# Patient Record
Sex: Male | Born: 1995 | Race: White | Hispanic: No | Marital: Married | Smoking: Former smoker
Health system: Southern US, Community
[De-identification: ages and names within clinical notes are randomized; demographics above are authoritative.]

## PROBLEM LIST (undated history)

## (undated) DIAGNOSIS — E049 Nontoxic goiter, unspecified: Secondary | ICD-10-CM

## (undated) DIAGNOSIS — F909 Attention-deficit hyperactivity disorder, unspecified type: Secondary | ICD-10-CM

## (undated) DIAGNOSIS — R630 Anorexia: Secondary | ICD-10-CM

## (undated) DIAGNOSIS — K219 Gastro-esophageal reflux disease without esophagitis: Secondary | ICD-10-CM

## (undated) DIAGNOSIS — E300 Delayed puberty: Secondary | ICD-10-CM

## (undated) DIAGNOSIS — R109 Unspecified abdominal pain: Secondary | ICD-10-CM

## (undated) DIAGNOSIS — G259 Extrapyramidal and movement disorder, unspecified: Secondary | ICD-10-CM

## (undated) DIAGNOSIS — R6250 Unspecified lack of expected normal physiological development in childhood: Secondary | ICD-10-CM

## (undated) HISTORY — DX: Unspecified lack of expected normal physiological development in childhood: R62.50

## (undated) HISTORY — DX: Unspecified abdominal pain: R10.9

## (undated) HISTORY — DX: Extrapyramidal and movement disorder, unspecified: G25.9

## (undated) HISTORY — DX: Attention-deficit hyperactivity disorder, unspecified type: F90.9

## (undated) HISTORY — DX: Anorexia: R63.0

## (undated) HISTORY — DX: Delayed puberty: E30.0

## (undated) HISTORY — PX: SMALL INTESTINE SURGERY: SHX150

## (undated) HISTORY — DX: Nontoxic goiter, unspecified: E04.9

---
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
## 2015-01-03 DIAGNOSIS — J309 Allergic rhinitis, unspecified: Secondary | ICD-10-CM | POA: Insufficient documentation

## 2015-01-03 DIAGNOSIS — K219 Gastro-esophageal reflux disease without esophagitis: Secondary | ICD-10-CM | POA: Insufficient documentation

## 2015-01-03 DIAGNOSIS — F349 Persistent mood [affective] disorder, unspecified: Secondary | ICD-10-CM | POA: Insufficient documentation

## 2015-01-10 ENCOUNTER — Ambulatory Visit: Payer: Self-pay | Admitting: "Endocrinology

## 2015-08-13 ENCOUNTER — Encounter (HOSPITAL_COMMUNITY): Payer: Self-pay | Admitting: *Deleted

## 2015-08-13 ENCOUNTER — Emergency Department (HOSPITAL_COMMUNITY)
Admission: EM | Admit: 2015-08-13 | Discharge: 2015-08-13 | Disposition: A | Discharge status: Home / Self Care | Payer: Medicaid Other | Attending: Emergency Medicine | Admitting: Emergency Medicine

## 2015-08-13 DIAGNOSIS — Z79899 Other long term (current) drug therapy: Secondary | ICD-10-CM | POA: Diagnosis not present

## 2015-08-13 DIAGNOSIS — Z8639 Personal history of other endocrine, nutritional and metabolic disease: Secondary | ICD-10-CM | POA: Insufficient documentation

## 2015-08-13 DIAGNOSIS — Z8669 Personal history of other diseases of the nervous system and sense organs: Secondary | ICD-10-CM | POA: Diagnosis not present

## 2015-08-13 DIAGNOSIS — F909 Attention-deficit hyperactivity disorder, unspecified type: Secondary | ICD-10-CM | POA: Diagnosis not present

## 2015-08-13 DIAGNOSIS — L03115 Cellulitis of right lower limb: Secondary | ICD-10-CM

## 2015-08-13 DIAGNOSIS — L089 Local infection of the skin and subcutaneous tissue, unspecified: Secondary | ICD-10-CM | POA: Diagnosis present

## 2015-08-13 MED ORDER — HYDROCODONE-ACETAMINOPHEN 5-325 MG PO TABS: 2.0000 | ORAL_TABLET | ORAL | Status: DC | PRN

## 2015-08-13 NOTE — ED Notes (Signed)
The pt has a boil on his lt thigh  That has been there for 3 days.  It drained some last pm.  He has been taking an incomplete rx of doxycycline.  He has a hx of boils

## 2015-08-13 NOTE — Discharge Instructions (Signed)
Cellulitis Cellulitis is an infection of the skin and the tissue beneath it. The infected area is usually red and tender. Cellulitis occurs most often in the arms and lower legs.  CAUSES  Cellulitis is caused by bacteria that enter the skin through cracks or cuts in the skin. The most common types of bacteria that cause cellulitis are staphylococci and streptococci. SIGNS AND SYMPTOMS   Redness and warmth.  Swelling.  Tenderness or pain.  Fever. DIAGNOSIS  Your health care provider can usually determine what is wrong based on a physical exam. Blood tests may also be done. TREATMENT  Treatment usually involves taking an antibiotic medicine. HOME CARE INSTRUCTIONS   Take your antibiotic medicine as directed by your health care provider. Finish the antibiotic even if you start to feel better.  Keep the infected arm or leg elevated to reduce swelling.  Apply a warm cloth to the affected area up to 4 times per day to relieve pain.  Take medicines only as directed by your health care provider.  Keep all follow-up visits as directed by your health care provider. SEEK MEDICAL CARE IF:   You notice red streaks coming from the infected area.  Your red area gets larger or turns dark in color.  Your bone or joint underneath the infected area becomes painful after the skin has healed.  Your infection returns in the same area or another area.  You notice a swollen bump in the infected area.  You develop new symptoms.  You have a fever. SEEK IMMEDIATE MEDICAL CARE IF:   You feel very sleepy.  You develop vomiting or diarrhea.  You have a general ill feeling (malaise) with muscle aches and pains.   This information is not intended to replace advice given to you by your health care provider. Make sure you discuss any questions you have with your health care provider.   Document Released: 05/06/2005 Document Revised: 04/17/2015 Document Reviewed: 10/12/2011 Elsevier Interactive  Patient Education [employer]2016 Elsevier Inc.   Follow-up with your primary care provider if symptoms do not improve. Take anabiotic as prescribed. Apply warm compresses. Return to the emergency department if you experience severe worsening of your symptoms, spreading of the redness or increased swelling, drainage of the affected area, fever.

## 2015-08-13 NOTE — ED Provider Notes (Signed)
CSN: [id number]     Arrival date & time 08/13/15  2119 History  By signing my name below, I, [facility] Health InstituteMarrissa Washington, attest that this documentation has been prepared under the direction and in the presence of AvayaSamantha Timberlee Roblero, PA-C. Electronically Signed: Randell PatientMarrissa Washington, ED Scribe. 08/13/2015. 10:11 PM.    Chief Complaint  Patient presents with  . Recurrent Skin Infections   The history is provided by the patient and a relative. No language interpreter was used.   HPI Comments: Andrew Lynch is a 20 y.o. male with an hx of boils who presents to the Emergency Department complaining of a moderately painful, gradually worsening boil on his lateral right thigh onset 3 days ago. Patient reports that the boil on his thigh increased in size and swelling over the the past 2 days until he popped it last night, producing some drainage from the site. He endorses increased redness in the area and that the area feels hard to the touch. Per patient, he was prescribed Tramadol for similar symptoms in the past with no relief of his pain. Patient states that his PCP prescribed him a course of doxycyline today and that he has taken 1 dose of this medication. He has tried warm compresses and applied fatback to the area with no relief. Relative notes that the patient has had similar symptoms due to an unspecified infection multiple times in the past located on his underarms that resolved with I&D and changing his deodorant. Patient denies receiving any pain medication in the past. He denies fever and vomiting.  Past Medical History  Diagnosis Date  . Physical growth delay   . ADHD (attention deficit hyperactivity disorder)   . Goiter   . Puberty delay   . Poor appetite   . Stomach pain   . Movement disorder    History reviewed. No pertinent past surgical history. Family History  Problem Relation Age of Onset  . Diabetes Paternal Uncle   . Diabetes Maternal Grandfather   . Thyroid disease Neg Hx     Social History  Substance Use Topics  . Smoking status: Never Smoker   . Smokeless tobacco: Never Used  . Alcohol Use: No    Review of Systems  Constitutional: Negative for fever.  Gastrointestinal: Negative for vomiting.  Skin: Positive for color change (Redness to right lateal thigh surrouding boil) and wound (Boil on right lateral thigh).  All other systems reviewed and are negative.     Allergies  Review of patient's allergies indicates no known allergies.  Home Medications   Prior to Admission medications   Medication Sig Start Date End Date Taking? Authorizing Provider  cephALEXin (KEFLEX) 500 MG capsule Take 1 capsule (500 mg total) by mouth 4 (four) times daily. 09/11/14   Emilia BeckKaitlyn Szekalski, PA-C  Cetirizine HCl 10 MG TBDP Take by mouth.    Historical Provider, MD  cyproheptadine (PERIACTIN) 4 MG tablet Take 2 tablets (8mg ) Twice daily 12/14/13   David StallMichael J Brennan, MD  diphenhydrAMINE (BENADRYL) 25 MG tablet Take 1 tablet (25 mg total) by mouth every 6 (six) hours as needed for itching. 08/07/14   Trixie DredgeEmily West, PA-C  esomeprazole (NEXIUM) 40 MG capsule Take 40 mg by mouth daily before breakfast.      Historical Provider, MD  FLUoxetine (PROZAC) 10 MG tablet Take 10 mg by mouth daily.      Historical Provider, MD  HYDROcodone-acetaminophen (NORCO/VICODIN) 5-325 MG per tablet Take 1 tablet by mouth every 6 (six) hours as needed for  moderate pain or severe pain. 12/08/14   Hannah Muthersbaugh, PA-C  HYDROcodone-acetaminophen (NORCO/VICODIN) 5-325 MG tablet Take 2 tablets by mouth every 4 (four) hours as needed. 08/13/15   Leightyn Cina Tripp Neco Kling, PA-C  lisdexamfetamine (VYVANSE) 30 MG capsule Take 30 mg by mouth every morning. Trade drug    Historical Provider, MD  loratadine (CLARITIN) 10 MG tablet Take 10 mg by mouth daily.      Historical Provider, MD  Multiple Vitamins-Minerals (MULTIVITAMIN PO) Take by mouth.    Historical Provider, MD  permethrin (ELIMITE) 5 % cream Apply to  from neck to toes, even under fingernails. Apply before bedtime, leave on 12 hours and rinse off in the morning. If you are still itching in 14 days from application, please repeat 11/08/14   Joni Reining Pisciotta, PA-C  predniSONE (DELTASONE) 20 MG tablet 3 tabs po daily x 3 days, then 2 tabs x 3 days, then 1.5 tabs x 3 days, then 1 tab x 3 days, then 0.5 tabs x 3 days 08/07/14   Trixie Dredge, PA-C  traMADol (ULTRAM) 50 MG tablet Take 1 tablet (50 mg total) by mouth every 6 (six) hours as needed. 09/11/14   Kaitlyn Szekalski, PA-C   BP 132/77 mmHg  Pulse 88  Temp(Src) 98.8 F (37.1 C)  Resp 16  Ht 5\' 3"  (1.6 m)  Wt 141 lb 9 oz (64.212 kg)  BMI 25.08 kg/m2  SpO2 98% Physical Exam  Constitutional: He is oriented to person, place, and time. He appears well-developed and well-nourished. No distress.  HENT:  Head: Normocephalic and atraumatic.  Eyes: Conjunctivae are normal. Right eye exhibits no discharge. Left eye exhibits no discharge. No scleral icterus.  Cardiovascular: Normal rate.   Pulmonary/Chest: Effort normal.  Neurological: He is alert and oriented to person, place, and time. Coordination normal.  Skin: Skin is warm and dry. No rash noted. He is not diaphoretic. No erythema. No pallor.  3cm area of redness to lateral R thigh. No fluctuance. No streaking.   Psychiatric: He has a normal mood and affect. His behavior is normal.  Nursing note and vitals reviewed.   ED Course  Procedures   DIAGNOSTIC STUDIES: Oxygen Saturation is 98% on RA, normal by my interpretation.    COORDINATION OF CARE: 9:44 PM Will perform [place] of right lateral thigh. Advised pt to continue taking doxycycline. Discussed treatment plan with pt at bedside and pt agreed to plan.  Labs Review Labs Reviewed - No data to display  Imaging Review No results found. I have personally reviewed and evaluated these images and lab results as part of my medical decision-making.   EKG Interpretation None      MDM    Final diagnoses:  Cellulitis of right lower extremity    Bedside US performed by me. No fluid collection or drainable abscess noted. Pt was prescribed Doxycyline today by PCP. Pt afebrile, in NAD Encourage patient to continue taking doxycycline. Apply warm compresses. Pain medication given. Return precautions outlined in patient discharge instructions.    I personally performed the services described in this documentation, which was scribed in my presence. The recorded information has been reviewed and is accurate.     Lester Kinsman [place], PA-C 08/16/15 1708  Mancel Bale, MD 08/17/15 [phone]

## 2015-08-13 NOTE — ED Notes (Signed)
Pt. Left with all belongings and refused wheelchair. Discharge instructions were reviewed and all questions were answered.  

## 2015-08-15 ENCOUNTER — Emergency Department (HOSPITAL_COMMUNITY)
Admission: EM | Admit: 2015-08-15 | Discharge: 2015-08-15 | Disposition: A | Discharge status: Home / Self Care | Payer: Medicaid Other | Attending: Emergency Medicine | Admitting: Emergency Medicine

## 2015-08-15 ENCOUNTER — Encounter (HOSPITAL_COMMUNITY): Payer: Self-pay | Admitting: *Deleted

## 2015-08-15 DIAGNOSIS — Z8639 Personal history of other endocrine, nutritional and metabolic disease: Secondary | ICD-10-CM | POA: Diagnosis not present

## 2015-08-15 DIAGNOSIS — L02415 Cutaneous abscess of right lower limb: Secondary | ICD-10-CM | POA: Insufficient documentation

## 2015-08-15 DIAGNOSIS — Z792 Long term (current) use of antibiotics: Secondary | ICD-10-CM | POA: Insufficient documentation

## 2015-08-15 DIAGNOSIS — Z8669 Personal history of other diseases of the nervous system and sense organs: Secondary | ICD-10-CM | POA: Insufficient documentation

## 2015-08-15 DIAGNOSIS — F909 Attention-deficit hyperactivity disorder, unspecified type: Secondary | ICD-10-CM | POA: Insufficient documentation

## 2015-08-15 DIAGNOSIS — Z79899 Other long term (current) drug therapy: Secondary | ICD-10-CM | POA: Diagnosis not present

## 2015-08-15 MED ORDER — LIDOCAINE HCL 2 % IJ SOLN
10.0000 mL | Freq: Once | INTRAMUSCULAR | Status: AC
  Administered 2015-08-15: 200 mg
  Filled 2015-08-15: qty 20

## 2015-08-15 NOTE — ED Notes (Signed)
Pt c/o abscess to rt leg x 1 week. States that he was here and told that it looked good and prescribed doxycycline and hydrocodone-acetaminophen. States that hje has been taking them as prescribed.

## 2015-08-15 NOTE — Discharge Instructions (Signed)
Incision and Drainage Incision and drainage is a procedure in which a sac-like structure (cystic structure) is opened and drained. The area to be drained usually contains material such as pus, fluid, or blood.  LET YOUR CAREGIVER KNOW ABOUT:   Allergies to medicine.  Medicines taken, including vitamins, herbs, eyedrops, over-the-counter medicines, and creams.  Use of steroids (by mouth or creams).  Previous problems with anesthetics or numbing medicines.  History of bleeding problems or blood clots.  Previous surgery.  Other health problems, including diabetes and kidney problems.  Possibility of pregnancy, if this applies. RISKS AND COMPLICATIONS  Pain.  Bleeding.  Scarring.  Infection. BEFORE THE PROCEDURE  You may need to have an ultrasound or other imaging tests to see how large or deep your cystic structure is. Blood tests may also be used to determine if you have an infection or how severe the infection is. You may need to have a tetanus shot. PROCEDURE  The affected area is cleaned with a cleaning fluid. The cyst area will then be numbed with a medicine (local anesthetic). A small incision will be made in the cystic structure. A syringe or catheter may be used to drain the contents of the cystic structure, or the contents may be squeezed out. The area will then be flushed with a cleansing solution. After cleansing the area, it is often gently packed with a gauze or another wound dressing. Once it is packed, it will be covered with gauze and tape or some other type of wound dressing. AFTER THE PROCEDURE   Often, you will be allowed to go home right after the procedure.  You may be given antibiotic medicine to prevent or heal an infection.  If the area was packed with gauze or some other wound dressing, you will likely need to come back in 1 to 2 days to get it removed.  The area should heal in about 14 days.   This information is not intended to replace advice given  to you by your health care provider. Make sure you discuss any questions you have with your health care provider.   Document Released: 01/20/2001 Document Revised: 01/26/2012 Document Reviewed: 09/21/2011 Elsevier Interactive Patient Education 2016 Elsevier Inc.  

## 2015-08-15 NOTE — ED Notes (Signed)
States that it had a white head yesterday and last night pus came out. Today area is red with scab.

## 2015-08-15 NOTE — ED Provider Notes (Signed)
CSN: [id number]     Arrival date & time 08/15/15  1732 History   By signing my name below, I, Emmanuella Mensah, attest that this documentation has been prepared under the direction and in the presence of Cheron Schaumann, PA-C. Electronically Signed: Angelene Giovanni, ED Scribe. 08/15/2015. 7:11 PM.     Chief Complaint  Patient presents with  . Abscess   The history is provided by the patient. No language interpreter was used.   HPI Comments: Andrew Lynch is a 20 y.o. male who presents to the Emergency Department complaining of an area of redness and swelling with a white head to the right lateral side of the right thigh onset one week ago. He explains that he was seen 2 days ago and prescribed Doxycycline and Hydrocodone-acetaminophen but he is here today because the area is now draining and it looks more red than it did at that time. He reports NKDA. No alleviating factors noted.    Past Medical History  Diagnosis Date  . Physical growth delay   . ADHD (attention deficit hyperactivity disorder)   . Goiter   . Puberty delay   . Poor appetite   . Stomach pain   . Movement disorder    No past surgical history on file. Family History  Problem Relation Age of Onset  . Diabetes Paternal Uncle   . Diabetes Maternal Grandfather   . Thyroid disease Neg Hx    Social History  Substance Use Topics  . Smoking status: Never Smoker   . Smokeless tobacco: Never Used  . Alcohol Use: No    Review of Systems  Constitutional: Negative for fever.  Skin:       Area of redness and swelling to the right lateral side of right thigh  All other systems reviewed and are negative.     Allergies  Review of patient's allergies indicates no known allergies.  Home Medications   Prior to Admission medications   Medication Sig Start Date End Date Taking? Authorizing Provider  cephALEXin (KEFLEX) 500 MG capsule Take 1 capsule (500 mg total) by mouth 4 (four) times daily. 09/11/14   Emilia Beck, PA-C  Cetirizine HCl 10 MG TBDP Take by mouth.    Historical Provider, MD  cyproheptadine (PERIACTIN) 4 MG tablet Take 2 tablets (8mg ) Twice daily 12/14/13   David Stall, MD  diphenhydrAMINE (BENADRYL) 25 MG tablet Take 1 tablet (25 mg total) by mouth every 6 (six) hours as needed for itching. 08/07/14   Trixie Dredge, PA-C  esomeprazole (NEXIUM) 40 MG capsule Take 40 mg by mouth daily before breakfast.      Historical Provider, MD  FLUoxetine (PROZAC) 10 MG tablet Take 10 mg by mouth daily.      Historical Provider, MD  HYDROcodone-acetaminophen (NORCO/VICODIN) 5-325 MG per tablet Take 1 tablet by mouth every 6 (six) hours as needed for moderate pain or severe pain. 12/08/14   Hannah Muthersbaugh, PA-C  HYDROcodone-acetaminophen (NORCO/VICODIN) 5-325 MG tablet Take 2 tablets by mouth every 4 (four) hours as needed. 08/13/15   Samantha Tripp Dowless, PA-C  lisdexamfetamine (VYVANSE) 30 MG capsule Take 30 mg by mouth every morning. Trade drug    Historical Provider, MD  loratadine (CLARITIN) 10 MG tablet Take 10 mg by mouth daily.      Historical Provider, MD  Multiple Vitamins-Minerals (MULTIVITAMIN PO) Take by mouth.    Historical Provider, MD  permethrin (ELIMITE) 5 % cream Apply to from neck to toes, even under fingernails. Apply  before bedtime, leave on 12 hours and rinse off in the morning. If you are still itching in 14 days from application, please repeat 11/08/14   Joni ReiningNicole Pisciotta, PA-C  predniSONE (DELTASONE) 20 MG tablet 3 tabs po daily x 3 days, then 2 tabs x 3 days, then 1.5 tabs x 3 days, then 1 tab x 3 days, then 0.5 tabs x 3 days 08/07/14   Trixie DredgeEmily West, PA-C  traMADol (ULTRAM) 50 MG tablet Take 1 tablet (50 mg total) by mouth every 6 (six) hours as needed. 09/11/14   Kaitlyn Szekalski, PA-C   BP 110/81 mmHg  Pulse 88  Temp(Src) 97.9 F (36.6 C) (Oral)  Resp 18  SpO2 97% Physical Exam  Constitutional: He is oriented to person, place, and time. He appears well-developed and  well-nourished. No distress.  HENT:  Head: Normocephalic and atraumatic.  Eyes: Conjunctivae and EOM are normal.  Neck: Neck supple. No tracheal deviation present.  Cardiovascular: Normal rate.   Pulmonary/Chest: Effort normal. No respiratory distress.  Musculoskeletal: Normal range of motion.  Neurological: He is alert and oriented to person, place, and time.  Skin: Skin is warm and dry. There is erythema.  8 cm abscess right upper outer thigh  Psychiatric: He has a normal mood and affect. His behavior is normal.  Nursing note and vitals reviewed.   ED Course  Procedures (including critical care time) DIAGNOSTIC STUDIES: Oxygen Saturation is 97% on RA, adequate by my interpretation.    COORDINATION OF CARE: 6:47 PM- Pt advised of plan for treatment and pt agrees. Pt will receive an I&D.    INCISION AND DRAINAGE PROCEDURE NOTE: Patient identification was confirmed and verbal consent was obtained. This procedure was performed by Cheron SchaumannLeslie Sofia, PA-C at 7:02 PM. Site: right upper outer thigh  Sterile procedures observed Needle size: 27 gauge Anesthetic used (type and amt): 2% local Xylocaine Blade size: 11 Drainage: purulent drainage Complexity: Complex Packing used 1/8 inch gauge Site anesthetized, incision made over site, wound drained and explored loculations, rinsed with copious amounts of normal saline, wound packed with sterile gauze, covered with dry, sterile dressing.  Pt tolerated procedure well without complications.  Instructions for care discussed verbally and pt provided with additional written instructions for homecare and f/u.   MDM   Final diagnoses:  Abscess of right thigh    Recheck at urgent care in 2 days. Meds ordered this encounter  Medications  . lidocaine (XYLOCAINE) 2 % (with pres) injection 200 mg    Sig:     Elson AreasLeslie K Sofia, PA-C 08/16/15 0214  Laurence Spatesachel Morgan Little, MD 08/25/15 2355

## 2015-08-18 ENCOUNTER — Encounter (HOSPITAL_COMMUNITY): Payer: Self-pay | Admitting: Nurse Practitioner

## 2015-08-18 ENCOUNTER — Emergency Department (HOSPITAL_COMMUNITY)
Admission: EM | Admit: 2015-08-18 | Discharge: 2015-08-18 | Disposition: A | Discharge status: Home / Self Care | Payer: Medicaid Other | Attending: Emergency Medicine | Admitting: Emergency Medicine

## 2015-08-18 DIAGNOSIS — Z4801 Encounter for change or removal of surgical wound dressing: Secondary | ICD-10-CM | POA: Insufficient documentation

## 2015-08-18 DIAGNOSIS — F909 Attention-deficit hyperactivity disorder, unspecified type: Secondary | ICD-10-CM | POA: Insufficient documentation

## 2015-08-18 DIAGNOSIS — Z8669 Personal history of other diseases of the nervous system and sense organs: Secondary | ICD-10-CM | POA: Diagnosis not present

## 2015-08-18 DIAGNOSIS — Z79899 Other long term (current) drug therapy: Secondary | ICD-10-CM | POA: Diagnosis not present

## 2015-08-18 DIAGNOSIS — Z8639 Personal history of other endocrine, nutritional and metabolic disease: Secondary | ICD-10-CM | POA: Diagnosis not present

## 2015-08-18 DIAGNOSIS — Z792 Long term (current) use of antibiotics: Secondary | ICD-10-CM | POA: Insufficient documentation

## 2015-08-18 DIAGNOSIS — Z09 Encounter for follow-up examination after completed treatment for conditions other than malignant neoplasm: Secondary | ICD-10-CM

## 2015-08-18 NOTE — ED Provider Notes (Signed)
CSN: [id number]     Arrival date & time 08/18/15  1333 History  By signing my name below, I, Elon SpannerGarrett Cook, attest that this documentation has been prepared under the direction and in the presence of Trixie DredgeEmily Issiac Jamar, P. Electronically Signed: Elon SpannerGarrett Cook ED Scribe. 08/18/2015. 2:38 PM.    Chief Complaint  Patient presents with  . Skin Problem   The history is provided by the patient and medical records. No language interpreter was used.   HPI Comments: Andrew Lynch is a 20 y.o. male who presents to the Emergency Department for wound check of I&D site on right lateral thigh - states it is much improved, with decreased redness, swelling, and pain.  Seen in ED on 08/12/14 and dx'd with right lateral thigh cellullits seen on 1/6 and dx'd with an abscess.  I&D was performed at that time with packing still in place.  He denies fever, chills, body aches.  Patient has been compliant with his doxycycline rx' on 1/3.   Past Medical History  Diagnosis Date  . Physical growth delay   . ADHD (attention deficit hyperactivity disorder)   . Goiter   . Puberty delay   . Poor appetite   . Stomach pain   . Movement disorder    History reviewed. No pertinent past surgical history. Family History  Problem Relation Age of Onset  . Diabetes Paternal Uncle   . Diabetes Maternal Grandfather   . Thyroid disease Neg Hx    Social History  Substance Use Topics  . Smoking status: Never Smoker   . Smokeless tobacco: Never Used  . Alcohol Use: No    Review of Systems  Constitutional: Negative for fever and chills.  Cardiovascular: Negative for leg swelling.  Musculoskeletal: Negative for myalgias and arthralgias.  Skin: Positive for color change and wound.  Allergic/Immunologic: Negative for immunocompromised state.  Psychiatric/Behavioral: Negative for self-injury.      Allergies  Review of patient's allergies indicates no known allergies.  Home Medications   Prior to Admission medications    Medication Sig Start Date End Date Taking? Authorizing Provider  cephALEXin (KEFLEX) 500 MG capsule Take 1 capsule (500 mg total) by mouth 4 (four) times daily. 09/11/14   Emilia BeckKaitlyn Szekalski, PA-C  Cetirizine HCl 10 MG TBDP Take by mouth.    Historical Provider, MD  cyproheptadine (PERIACTIN) 4 MG tablet Take 2 tablets (8mg ) Twice daily 12/14/13   David StallMichael J Brennan, MD  diphenhydrAMINE (BENADRYL) 25 MG tablet Take 1 tablet (25 mg total) by mouth every 6 (six) hours as needed for itching. 08/07/14   Trixie DredgeEmily Laina Guerrieri, PA-C  esomeprazole (NEXIUM) 40 MG capsule Take 40 mg by mouth daily before breakfast.      Historical Provider, MD  FLUoxetine (PROZAC) 10 MG tablet Take 10 mg by mouth daily.      Historical Provider, MD  HYDROcodone-acetaminophen (NORCO/VICODIN) 5-325 MG per tablet Take 1 tablet by mouth every 6 (six) hours as needed for moderate pain or severe pain. 12/08/14   Hannah Muthersbaugh, PA-C  HYDROcodone-acetaminophen (NORCO/VICODIN) 5-325 MG tablet Take 2 tablets by mouth every 4 (four) hours as needed. 08/13/15   Samantha Tripp Dowless, PA-C  lisdexamfetamine (VYVANSE) 30 MG capsule Take 30 mg by mouth every morning. Trade drug    Historical Provider, MD  loratadine (CLARITIN) 10 MG tablet Take 10 mg by mouth daily.      Historical Provider, MD  Multiple Vitamins-Minerals (MULTIVITAMIN PO) Take by mouth.    Historical Provider, MD  permethrin [place](ELIMITE)  5 % cream Apply to from neck to toes, even under fingernails. Apply before bedtime, leave on 12 hours and rinse off in the morning. If you are still itching in 14 days from application, please repeat 11/08/14   Joni Reining Pisciotta, PA-C  predniSONE (DELTASONE) 20 MG tablet 3 tabs po daily x 3 days, then 2 tabs x 3 days, then 1.5 tabs x 3 days, then 1 tab x 3 days, then 0.5 tabs x 3 days 08/07/14   Trixie Dredge, PA-C  traMADol (ULTRAM) 50 MG tablet Take 1 tablet (50 mg total) by mouth every 6 (six) hours as needed. 09/11/14   Kaitlyn Szekalski, PA-C   BP 129/75  mmHg  Pulse 68  Temp(Src) 98.5 F (36.9 C) (Oral)  Resp 17  SpO2 99% Physical Exam  Constitutional: He appears well-developed and well-nourished. No distress.  HENT:  Head: Normocephalic and atraumatic.  Neck: Neck supple.  Pulmonary/Chest: Effort normal.  Neurological: He is alert.  Skin: He is not diaphoretic.  Right lateral thigh with small area of erythema with central incision with antibiotic packing placed.  Purulent/sanguinous discharge coating packing material.  Wound base is clean, no active purulent discharge.    Nursing note and vitals reviewed.   ED Course  Procedures (including critical care time)  DIAGNOSTIC STUDIES: Oxygen Saturation is 99% on RA, normal by my interpretation.    COORDINATION OF CARE:  2:42 PM Advised patient of proper wound care.  Return precautions advised.  Patient acknowledges and agrees with plan.    Labs Review Labs Reviewed - No data to display  Imaging Review No results found. I have personally reviewed and evaluated these images and lab results as part of my medical decision-making.   EKG Interpretation None      MDM   Final diagnoses:  Encounter for recheck of abscess following incision and drainage   Afebrile, nontoxic patient with improving right thigh cellulitis and abscess.  Taking antibiotics (doxycyline) as directed.  Packing removed by pt in front of me (his preference).  Abscess if healing well.   D/C home with instructions to continue antibiotics until finished.  Pt reports pain improved, no further Rx pain medication.  Discussed result, findings, treatment, and follow up  with patient.  Pt given return precautions.  Pt verbalizes understanding and agrees with plan.       I personally performed the services described in this documentation, which was scribed in my presence. The recorded information has been reviewed and is accurate.    Trixie Dredge, PA-C 08/18/15 1453  Arby Barrette, MD 08/18/15 [phone]

## 2015-08-18 NOTE — ED Notes (Signed)
He was told to return yesterday for recheck of wound and packing removal, he couldn't come yesterday due to snow. He denies any complaints, states the wound "Feels good" but he has not removed the dressing at home to look at it.

## 2015-08-18 NOTE — Discharge Instructions (Signed)
Read the information below.  You may return to the Emergency Department at any time for worsening condition or any new symptoms that concern you.  Continue taking your antibiotics until they are gone.  If you develop increasing redness, swelling, uncontrolled pain, or fevers greater than 100.4, return to the ER immediately for a recheck.     Wound Care Taking care of your wound properly can help to prevent pain and infection. It can also help your wound to heal more quickly.  HOW TO CARE FOR YOUR WOUND  Take or apply over-the-counter and prescription medicines only as told by your health care provider.  If you were prescribed antibiotic medicine, take or apply it as told by your health care provider. Do not stop using the antibiotic even if your condition improves.  Clean the wound each day or as told by your health care provider.  Wash the wound with mild soap and water.  Rinse the wound with water to remove all soap.  Pat the wound dry with a clean towel. Do not rub it.  There are many different ways to close and cover a wound. For example, a wound can be covered with stitches (sutures), skin glue, or adhesive strips. Follow instructions from your health care provider about:  How to take care of your wound.  When and how you should change your bandage (dressing).  When you should remove your dressing.  Removing whatever was used to close your wound.  Check your wound every day for signs of infection. Watch for:  Redness, swelling, or pain.  Fluid, blood, or pus.  Keep the dressing dry until your health care provider says it can be removed. Do not take baths, swim, use a hot tub, or do anything that would put your wound underwater until your health care provider approves.  Raise (elevate) the injured area above the level of your heart while you are sitting or lying down.  Do not scratch or pick at the wound.  Keep all follow-up visits as told by your health care provider.  This is important. SEEK MEDICAL CARE IF:  You received a tetanus shot and you have swelling, severe pain, redness, or bleeding at the injection site.  You have a fever.  Your pain is not controlled with medicine.  You have increased redness, swelling, or pain at the site of your wound.  You have fluid, blood, or pus coming from your wound.  You notice a bad smell coming from your wound or your dressing. SEEK IMMEDIATE MEDICAL CARE IF:  You have a red streak going away from your wound.   This information is not intended to replace advice given to you by your health care provider. Make sure you discuss any questions you have with your health care provider.   Document Released: 05/05/2008 Document Revised: 12/11/2014 Document Reviewed: 07/23/2014 Elsevier Interactive Patient Education [employer]2016 [employer]vier Inc.

## 2015-08-18 NOTE — ED Notes (Signed)
Declined W/C at D/C and was escorted to lobby by RN. 

## 2015-11-28 ENCOUNTER — Encounter (HOSPITAL_COMMUNITY): Payer: Self-pay | Admitting: *Deleted

## 2015-11-28 ENCOUNTER — Emergency Department (HOSPITAL_COMMUNITY): Payer: Medicaid Other

## 2015-11-28 ENCOUNTER — Emergency Department (HOSPITAL_COMMUNITY)
Admission: EM | Admit: 2015-11-28 | Discharge: 2015-11-28 | Disposition: A | Discharge status: Home / Self Care | Payer: Medicaid Other | Attending: Emergency Medicine | Admitting: Emergency Medicine

## 2015-11-28 DIAGNOSIS — Z79899 Other long term (current) drug therapy: Secondary | ICD-10-CM | POA: Diagnosis not present

## 2015-11-28 DIAGNOSIS — X501XXA Overexertion from prolonged static or awkward postures, initial encounter: Secondary | ICD-10-CM | POA: Diagnosis not present

## 2015-11-28 DIAGNOSIS — Z8639 Personal history of other endocrine, nutritional and metabolic disease: Secondary | ICD-10-CM | POA: Insufficient documentation

## 2015-11-28 DIAGNOSIS — Z8669 Personal history of other diseases of the nervous system and sense organs: Secondary | ICD-10-CM | POA: Insufficient documentation

## 2015-11-28 DIAGNOSIS — Z792 Long term (current) use of antibiotics: Secondary | ICD-10-CM | POA: Insufficient documentation

## 2015-11-28 DIAGNOSIS — F909 Attention-deficit hyperactivity disorder, unspecified type: Secondary | ICD-10-CM | POA: Diagnosis not present

## 2015-11-28 DIAGNOSIS — S39012A Strain of muscle, fascia and tendon of lower back, initial encounter: Secondary | ICD-10-CM | POA: Insufficient documentation

## 2015-11-28 DIAGNOSIS — Y9289 Other specified places as the place of occurrence of the external cause: Secondary | ICD-10-CM | POA: Insufficient documentation

## 2015-11-28 DIAGNOSIS — Y9344 Activity, trampolining: Secondary | ICD-10-CM | POA: Diagnosis not present

## 2015-11-28 DIAGNOSIS — Y998 Other external cause status: Secondary | ICD-10-CM | POA: Insufficient documentation

## 2015-11-28 DIAGNOSIS — S3992XA Unspecified injury of lower back, initial encounter: Secondary | ICD-10-CM | POA: Diagnosis present

## 2015-11-28 MED ORDER — METHOCARBAMOL 500 MG PO TABS: 500.0000 mg | ORAL_TABLET | Freq: Two times a day (BID) | ORAL | Status: DC

## 2015-11-28 MED ORDER — DICLOFENAC SODIUM 50 MG PO TBEC: 50.0000 mg | DELAYED_RELEASE_TABLET | Freq: Two times a day (BID) | ORAL | Status: DC

## 2015-11-28 NOTE — ED Provider Notes (Signed)
CSN: [id number]     Arrival date & time 11/28/15  1832 History  By signing my name below, I, Bethel Born, attest that this documentation has been prepared under the direction and in the presence of [facility] NP. Electronically Signed: Bethel Born, ED Scribe. 11/28/2015 7:39 PM   Chief Complaint  Patient presents with  . Back Pain   The history is provided by the patient. No language interpreter was used.   Grayton Hooper Petteway is a 20 y.o. male who presents to the Emergency Department complaining of, new, constant, 5/10 in severity, bilateral lower back pain with onset 2 weeks ago after landing wrong on a trampoline. Certain movements exacerbate the pain. He took nothing for pain at home. Pt denies other injury.   Past Medical History  Diagnosis Date  . Physical growth delay   . ADHD (attention deficit hyperactivity disorder)   . Goiter   . Puberty delay   . Poor appetite   . Stomach pain   . Movement disorder    History reviewed. No pertinent past surgical history. Family History  Problem Relation Age of Onset  . Diabetes Paternal Uncle   . Diabetes Maternal Grandfather   . Thyroid disease Neg Hx    Social History  Substance Use Topics  . Smoking status: Never Smoker   . Smokeless tobacco: Never Used  . Alcohol Use: No    Review of Systems  Musculoskeletal: Positive for back pain.  All other systems reviewed and are negative.     Allergies  Review of patient's allergies indicates no known allergies.  Home Medications   Prior to Admission medications   Medication Sig Start Date End Date Taking? Authorizing Provider  cephALEXin (KEFLEX) 500 MG capsule Take 1 capsule (500 mg total) by mouth 4 (four) times daily. 09/11/14   Emilia Beck, PA-C  Cetirizine HCl 10 MG TBDP Take by mouth.    Historical Provider, MD  cyproheptadine (PERIACTIN) 4 MG tablet Take 2 tablets ( ) Twice daily 12/14/13   David Stall, MD  diclofenac (VOLTAREN) 50 MG EC tablet  Take 1 tablet (50 mg total) by mouth 2 (two) times daily. 11/28/15   Hope Orlene Och, NP  diphenhydrAMINE (BENADRYL) 25 MG tablet Take 1 tablet (25 mg total) by mouth every 6 (six) hours as needed for itching. 08/07/14   Trixie Dredge, PA-C  esomeprazole (NEXIUM) 40 MG capsule Take 40 mg by mouth daily before breakfast.      Historical Provider, MD  FLUoxetine (PROZAC) 10 MG tablet Take 10 mg by mouth daily.      Historical Provider, MD  HYDROcodone-acetaminophen (NORCO/VICODIN) 5-325 MG per tablet Take 1 tablet by mouth every 6 (six) hours as needed for moderate pain or severe pain. 12/08/14   Hannah Muthersbaugh, PA-C  HYDROcodone-acetaminophen (NORCO/VICODIN) 5-325 MG tablet Take 2 tablets by mouth every 4 (four) hours as needed. 08/13/15   Samantha Tripp Dowless, PA-C  lisdexamfetamine (VYVANSE) 30 MG capsule Take 30 mg by mouth every morning. Trade drug    Historical Provider, MD  loratadine (CLARITIN) 10 MG tablet Take 10 mg by mouth daily.      Historical Provider, MD  methocarbamol (ROBAXIN) 500 MG tablet Take 1 tablet (500 mg total) by mouth 2 (two) times daily. 11/28/15   Hope Orlene Och, NP  Multiple Vitamins-Minerals (MULTIVITAMIN PO) Take by mouth.    Historical Provider, MD  permethrin (ELIMITE) 5 % cream Apply to from neck to toes, even under fingernails. Apply before bedtime, leave  on 12 hours and rinse off in the morning. If you are still itching in 14 days from application, please repeat 11/08/14   Joni Reining Pisciotta, PA-C  predniSONE (DELTASONE) 20 MG tablet 3 tabs po daily x 3 days, then 2 tabs x 3 days, then 1.5 tabs x 3 days, then 1 tab x 3 days, then 0.5 tabs x 3 days 08/07/14   Trixie Dredge, PA-C  traMADol (ULTRAM) 50 MG tablet Take 1 tablet (50 mg total) by mouth every 6 (six) hours as needed. 09/11/14   Kaitlyn Szekalski, PA-C   BP 131/72 mmHg  Pulse 97  Temp(Src) 97.9 F (36.6 C) (Oral)  SpO2 98% Physical Exam  Constitutional: He is oriented to person, place, and time. He appears  well-developed and well-nourished. No distress.  HENT:  Head: Normocephalic and atraumatic.  Eyes: EOM are normal. Pupils are equal, round, and reactive to light.  Neck: Normal range of motion. Neck supple.  Cardiovascular: Normal rate and regular rhythm.   Pulmonary/Chest: Effort normal. No respiratory distress. He has no wheezes. He has no rales.  Abdominal: Soft. Bowel sounds are normal. There is no tenderness.  Musculoskeletal: Normal range of motion. He exhibits no edema.       Lumbar back: He exhibits tenderness and spasm. He exhibits normal range of motion, no deformity and normal pulse.       Back:  Pedal pulses 2+, adequate circulation.  Neurological: He is alert and oriented to person, place, and time. He has normal strength. No cranial nerve deficit or sensory deficit. Coordination and gait normal.  Reflex Scores:      Bicep reflexes are 2+ on the right side and 2+ on the left side.      Brachioradialis reflexes are 2+ on the right side and 2+ on the left side.      Patellar reflexes are 2+ on the right side and 2+ on the left side.      Achilles reflexes are 2+ on the right side and 2+ on the left side. Skin: Skin is warm and dry.  Psychiatric: He has a normal mood and affect. His behavior is normal.  Nursing note and vitals reviewed.   ED Course  Procedures (including critical care time) DIAGNOSTIC STUDIES: Oxygen Saturation is 98% on RA,  normal by my interpretation.    COORDINATION OF CARE: 6:59 PM Discussed treatment plan which includes lumbar spine XR with pt at bedside and pt agreed to plan.  Labs Review Labs Reviewed - No data to display  Imaging Review Dg Lumbar Spine Complete  11/28/2015  CLINICAL DATA:  Twisting injury on trampoline with pain across the lower back. Initial encounter. EXAM: LUMBAR SPINE - COMPLETE 4+ VIEW COMPARISON:  None. FINDINGS: Lumbar levo curvature which could be positional. No subluxation or fracture deformity. No focal bone finding.  IMPRESSION: Negative. Electronically Signed   By: Marnee Spring M.D.   On: 11/28/2015 19:30    MDM  20 y.o. male with low back pain s/p pain after jumping on a trampoline and landing wrong 2 weeks ago. Stable for d/c without focal neuro deficits and no red flags to indicate need for immediate neuro consult. Will treat with muscle relaxants and NSAIDS and patient will f/u with ortho if symptoms persist.  Final diagnoses:  Lumbosacral strain, initial encounter    I personally performed the services described in this documentation, which was scribed in my presence. The recorded information has been reviewed and is accurate.    Hope M  Damian LeavellNeese, NP 11/28/15 1945  Nelva Nayobert Beaton, MD 11/30/15 [phone]

## 2015-11-28 NOTE — ED Notes (Signed)
Pt states 2 weeks ago he was jumping on a trampoline and landed wrong causing back pain. States that he back pain has not gotten any better. Has not taken anything OTC for pain.

## 2015-11-28 NOTE — Discharge Instructions (Signed)
Your x-ray today was normal. Take the medication as directed. Follow up with Dr. Eulah PontMurphy if symptoms persist.

## 2016-05-07 ENCOUNTER — Emergency Department (HOSPITAL_COMMUNITY)
Admission: EM | Admit: 2016-05-07 | Discharge: 2016-05-07 | Disposition: A | Discharge status: Home / Self Care | Payer: Medicaid Other | Attending: Emergency Medicine | Admitting: Emergency Medicine

## 2016-05-07 ENCOUNTER — Encounter (HOSPITAL_COMMUNITY): Payer: Self-pay | Admitting: Physical Medicine and Rehabilitation

## 2016-05-07 DIAGNOSIS — Z79899 Other long term (current) drug therapy: Secondary | ICD-10-CM | POA: Insufficient documentation

## 2016-05-07 DIAGNOSIS — F909 Attention-deficit hyperactivity disorder, unspecified type: Secondary | ICD-10-CM | POA: Diagnosis not present

## 2016-05-07 DIAGNOSIS — R21 Rash and other nonspecific skin eruption: Secondary | ICD-10-CM | POA: Diagnosis present

## 2016-05-07 DIAGNOSIS — L259 Unspecified contact dermatitis, unspecified cause: Secondary | ICD-10-CM | POA: Diagnosis not present

## 2016-05-07 MED ORDER — PREDNISONE 10 MG PO TABS: 20.0000 mg | ORAL_TABLET | Freq: Two times a day (BID) | ORAL | 0 refills | Status: DC

## 2016-05-07 NOTE — ED Provider Notes (Signed)
[facility]-EMERGENCY DEPT Provider Note   CSN: [id number] Arrival date & time: 05/07/16  1843  By signing my name below, I, Andrew Lynch, attest that this documentation has been prepared under the direction and in the presence of [name], NP Electronically Signed: Soijett Lynch, ED Scribe. 05/07/16. 8:29 PM.   History   Chief Complaint Chief Complaint  Patient presents with  . Rash    HPI   Andrew Lynch is a 20 y.o. male who presents to the Emergency Department complaining of non-pruritic rash to his right arm and genital area onset 2 days. Pt notes that he was working outside near a tree that has poison oak on it and began to have a rash following. Pt denies pain to the area. Pt denies new soaps, medications, pets, environment, lotion, and detergent. Pt is having associated symptoms of color change. He notes that he has not tried any medications for the relief of his symptoms. He denies wound, fever, chills, and any other symptoms.     The history is provided by the patient. No language interpreter was used.  Rash   This is a new problem. The current episode started 2 days ago. The problem has not changed since onset.The problem is associated with plant contact. There has been no fever. The rash is present on the right arm and genitalia. The patient is experiencing no pain. Pertinent negatives include no blisters, no itching and no pain. He has tried nothing for the symptoms. The treatment provided no relief.    Past Medical History:  Diagnosis Date  . ADHD (attention deficit hyperactivity disorder)   . Goiter   . Movement disorder   . Physical growth delay   . Poor appetite   . Puberty delay   . Stomach pain     Patient Active Problem List   Diagnosis Date Noted  . 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 Benign paroxysmal positional vertigo 02/21/2013  . Dyspepsia 02/21/2013  . Orthostatic hypotension 11/21/2012  . Physical growth delay   . ADHD (attention deficit  hyperactivity disorder)   . Goiter   . Puberty delay   . Poor appetite   . Stomach pain   . Lack of expected normal physiological development in childhood 01/21/2011  . Goiter, unspecified 01/21/2011  . 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 No pertinent surgical history.     Home Medications    Prior to Admission medications   Medication Sig Start Date End Date Taking? Authorizing Provider  cephALEXin (KEFLEX) 500 MG capsule Take 1 capsule (500 mg total) by mouth 4 (four) times daily. 09/11/14   Emilia BeckKaitlyn Szekalski, PA-C  Cetirizine HCl 10 MG TBDP Take by mouth.    Historical Provider, MD  cyproheptadine (PERIACTIN) 4 MG tablet Take 2 tablets (8mg ) Twice daily 12/14/13   David StallMichael J Brennan, MD  diclofenac (VOLTAREN) 50 MG EC tablet Take 1 tablet (50 mg total) by mouth 2 (two) times daily. 11/28/15   Jerimie Mancuso Orlene OchM Kema Santaella, NP  diphenhydrAMINE (BENADRYL) 25 MG tablet Take 1 tablet (25 mg total) by mouth every 6 (six) hours as needed for itching. 08/07/14   Trixie DredgeEmily West, PA-C  esomeprazole (NEXIUM) 40 MG capsule Take 40 mg by mouth daily before breakfast.      Historical Provider, MD  FLUoxetine (PROZAC) 10 MG tablet Take 10 mg by mouth daily.      Historical Provider, MD  HYDROcodone-acetaminophen (NORCO/VICODIN) 5-325 MG per tablet Take 1 tablet by  mouth every 6 (six) hours as needed for moderate pain or severe pain. 12/08/14   Hannah Muthersbaugh, PA-C  HYDROcodone-acetaminophen (NORCO/VICODIN) 5-325 MG tablet Take 2 tablets by mouth every 4 (four) hours as needed. 08/13/15   Samantha Tripp Dowless, PA-C  lisdexamfetamine (VYVANSE) 30 MG capsule Take 30 mg by mouth every morning. Trade drug    Historical Provider, MD  loratadine (CLARITIN) 10 MG tablet Take 10 mg by mouth daily.      Historical Provider, MD  methocarbamol (ROBAXIN) 500 MG tablet Take 1 tablet (500 mg total) by mouth 2 (two) times daily. 11/28/15   Araya Roel Orlene Och, NP  Multiple  Vitamins-Minerals (MULTIVITAMIN PO) Take by mouth.    Historical Provider, MD  permethrin (ELIMITE) 5 % cream Apply to from neck to toes, even under fingernails. Apply before bedtime, leave on 12 hours and rinse off in the morning. If you are still itching in 14 days from application, please repeat 11/08/14   Joni Reining Pisciotta, PA-C  predniSONE (DELTASONE) 10 MG tablet Take 2 tablets (20 mg total) by mouth 2 (two) times daily with a meal. 05/07/16   Jeremiyah Cullens Orlene Och, NP  traMADol (ULTRAM) 50 MG tablet Take 1 tablet (50 mg total) by mouth every 6 (six) hours as needed. 09/11/14   Emilia Beck, PA-C    Family History Family History  Problem Relation Age of Onset  . Diabetes Maternal Grandfather   . Diabetes Paternal Uncle   . Thyroid disease Neg Hx     Social History Social History  Substance Use Topics  . Smoking status: Never Smoker  . Smokeless tobacco: Never Used  . Alcohol use No     Allergies   Review of patient's allergies indicates no known allergies.   Review of Systems Review of Systems  Constitutional: Negative for chills and fever.  Skin: Positive for color change and rash (right forearm and genital area). Negative for itching and wound.  All other systems reviewed and are negative.    Physical Exam Updated Vital Signs BP 124/64 (BP Location: Left Arm)   Pulse 81   Temp 98.2 F (36.8 C) (Oral)   Resp 16   Ht 5\' 3"  (1.6 m)   Wt 68.9 kg   SpO2 99%   BMI 26.89 kg/m   Physical Exam  Constitutional: He is oriented to person, place, and time. He appears well-developed and well-nourished. No distress.  HENT:  Head: Normocephalic and atraumatic.  Eyes: EOM are normal.  Neck: Neck supple.  Cardiovascular: Normal rate.   Pulmonary/Chest: Effort normal. No respiratory distress.  Abdominal: He exhibits no distension.  Genitourinary:  Genitourinary Comments: Scribe chaperone present for exam.   Musculoskeletal: Normal range of motion.  Neurological: He is alert  and oriented to person, place, and time.  Skin: Skin is warm and dry. Rash noted. Rash is vesicular.  Small raised linear vesicular rash to the right forearm and to shaft of penis. No red streaking.   Psychiatric: He has a normal mood and affect. His behavior is normal.  Nursing note and vitals reviewed.    ED Treatments / Results  DIAGNOSTIC STUDIES: Oxygen Saturation is 99% on RA, nl by my interpretation.    COORDINATION OF CARE: 8:28 PM Discussed treatment plan with pt at bedside which includes prednisone Rx and pt agreed to plan.   Procedures Procedures (including critical care time)  Medications Ordered in ED Medications - No data to display   Initial Impression / Assessment and Plan / ED Course  I have reviewed the triage vital signs and the nursing notes.   Clinical Course   Patient with contact dermatitis due to plant. Instructed to avoid offending agent and to use unscented soaps, lotions, and detergents. Will treat with prednisone Rx.  No signs of secondary infection. Follow up with PCP in 2-3 days. Return precautions discussed. Pt is safe for discharge at this time.   Final Clinical Impressions(s) / ED Diagnoses   Final diagnoses:  Contact dermatitis    New Prescriptions Discharge Medication List as of 05/07/2016  8:29 PM      I personally performed the services described in this documentation, which was scribed in my presence. The recorded information has been reviewed and is accurate.     [place], [place] 05/09/16 2119    Lavera Guise, MD 05/14/16 [phone]

## 2016-05-07 NOTE — ED Notes (Signed)
Pt dc home with significant other 

## 2016-05-07 NOTE — Discharge Instructions (Signed)
You may take Benadryl for itching and use Calamine lotion to help dry the areas.

## 2016-05-07 NOTE — ED Triage Notes (Signed)
Pt reports rash to R arm and genital area. Thinks this could be poison oak. States he has been working outside recently. States severe itching and discomfort.

## 2016-06-09 ENCOUNTER — Encounter (HOSPITAL_COMMUNITY): Payer: Self-pay

## 2016-06-09 ENCOUNTER — Emergency Department (HOSPITAL_COMMUNITY)
Admission: EM | Admit: 2016-06-09 | Discharge: 2016-06-10 | Disposition: A | Discharge status: Home / Self Care | Payer: Medicaid Other | Attending: Emergency Medicine | Admitting: Emergency Medicine

## 2016-06-09 DIAGNOSIS — R45851 Suicidal ideations: Secondary | ICD-10-CM

## 2016-06-09 DIAGNOSIS — F909 Attention-deficit hyperactivity disorder, unspecified type: Secondary | ICD-10-CM | POA: Insufficient documentation

## 2016-06-09 DIAGNOSIS — Z79899 Other long term (current) drug therapy: Secondary | ICD-10-CM | POA: Diagnosis not present

## 2016-06-09 DIAGNOSIS — F331 Major depressive disorder, recurrent, moderate: Secondary | ICD-10-CM | POA: Diagnosis not present

## 2016-06-09 DIAGNOSIS — R440 Auditory hallucinations: Secondary | ICD-10-CM | POA: Insufficient documentation

## 2016-06-09 DIAGNOSIS — E876 Hypokalemia: Secondary | ICD-10-CM | POA: Diagnosis not present

## 2016-06-09 DIAGNOSIS — Z833 Family history of diabetes mellitus: Secondary | ICD-10-CM | POA: Diagnosis not present

## 2016-06-09 LAB — CBC
HCT: 43.6 % (ref 39.0–52.0)
HEMOGLOBIN: 15.8 g/dL (ref 13.0–17.0)
MCH: 30 pg (ref 26.0–34.0)
MCHC: 36.2 g/dL — ABNORMAL HIGH (ref 30.0–36.0)
MCV: 82.7 fL (ref 78.0–100.0)
Platelets: 292 10*3/uL (ref 150–400)
RBC: 5.27 MIL/uL (ref 4.22–5.81)
RDW: 11.9 % (ref 11.5–15.5)
WBC: 7.6 10*3/uL (ref 4.0–10.5)

## 2016-06-09 LAB — ETHANOL

## 2016-06-09 LAB — COMPREHENSIVE METABOLIC PANEL
ALT: 14 U/L — AB (ref 17–63)
AST: 17 U/L (ref 15–41)
Albumin: 5.1 g/dL — ABNORMAL HIGH (ref 3.5–5.0)
Alkaline Phosphatase: 75 U/L (ref 38–126)
Anion gap: 7 (ref 5–15)
BUN: 7 mg/dL (ref 6–20)
CHLORIDE: 105 mmol/L (ref 101–111)
CO2: 26 mmol/L (ref 22–32)
CREATININE: 0.91 mg/dL (ref 0.61–1.24)
Calcium: 9.4 mg/dL (ref 8.9–10.3)
GFR calc non Af Amer: >60 mL/min (ref >60)
Glucose, Bld: 145 mg/dL — ABNORMAL HIGH (ref 65–99)
POTASSIUM: 3 mmol/L — AB (ref 3.5–5.1)
SODIUM: 138 mmol/L (ref 135–145)
Total Bilirubin: 0.9 mg/dL (ref 0.3–1.2)
Total Protein: 8.5 g/dL — ABNORMAL HIGH (ref 6.5–8.1)

## 2016-06-09 LAB — SALICYLATE LEVEL

## 2016-06-09 LAB — RAPID URINE DRUG SCREEN, HOSP PERFORMED
AMPHETAMINES: NOT DETECTED
BENZODIAZEPINES: NOT DETECTED
Barbiturates: NOT DETECTED
Cocaine: NOT DETECTED
OPIATES: NOT DETECTED
TETRAHYDROCANNABINOL: NOT DETECTED

## 2016-06-09 LAB — ACETAMINOPHEN LEVEL: Acetaminophen (Tylenol), Serum: <10 ug/mL — ABNORMAL LOW (ref 10–30)

## 2016-06-09 MED ORDER — FLUTICASONE PROPIONATE 50 MCG/ACT NA SUSP
1.0000 | Freq: Every day | NASAL | Status: DC
  Filled 2016-06-09: qty 16

## 2016-06-09 MED ORDER — IBUPROFEN 200 MG PO TABS: 600.0000 mg | ORAL_TABLET | Freq: Three times a day (TID) | ORAL | Status: DC | PRN

## 2016-06-09 MED ORDER — ACETAMINOPHEN 325 MG PO TABS: 650.0000 mg | ORAL_TABLET | ORAL | Status: DC | PRN

## 2016-06-09 MED ORDER — LORATADINE 10 MG PO TABS
10.0000 mg | ORAL_TABLET | Freq: Every day | ORAL | Status: DC
  Administered 2016-06-10: 10 mg via ORAL
  Filled 2016-06-09: qty 1

## 2016-06-09 MED ORDER — BUSPIRONE HCL 10 MG PO TABS
10.0000 mg | ORAL_TABLET | Freq: Two times a day (BID) | ORAL | Status: DC
  Administered 2016-06-09 – 2016-06-10 (×2): 10 mg via ORAL
  Filled 2016-06-09 (×2): qty 1

## 2016-06-09 MED ORDER — HYOSCYAMINE SULFATE 0.125 MG SL SUBL
0.1250 mg | SUBLINGUAL_TABLET | Freq: Four times a day (QID) | SUBLINGUAL | Status: DC | PRN
  Filled 2016-06-09: qty 1

## 2016-06-09 MED ORDER — DIPHENHYDRAMINE HCL 25 MG PO CAPS: 25.0000 mg | ORAL_CAPSULE | Freq: Four times a day (QID) | ORAL | Status: DC | PRN

## 2016-06-09 MED ORDER — POTASSIUM CHLORIDE CRYS ER 20 MEQ PO TBCR
60.0000 meq | EXTENDED_RELEASE_TABLET | Freq: Once | ORAL | Status: AC
  Administered 2016-06-09: 60 meq via ORAL
  Filled 2016-06-09: qty 3

## 2016-06-09 MED ORDER — ZOLPIDEM TARTRATE 5 MG PO TABS: 5.0000 mg | ORAL_TABLET | Freq: Every evening | ORAL | Status: DC | PRN

## 2016-06-09 MED ORDER — PANTOPRAZOLE SODIUM 40 MG PO TBEC
40.0000 mg | DELAYED_RELEASE_TABLET | Freq: Every day | ORAL | Status: DC
  Administered 2016-06-10: 40 mg via ORAL
  Filled 2016-06-09: qty 1

## 2016-06-09 MED ORDER — ALUM & MAG HYDROXIDE-SIMETH 200-200-20 MG/5ML PO SUSP: 30.0000 mL | ORAL | Status: DC | PRN

## 2016-06-09 MED ORDER — ONDANSETRON HCL 4 MG PO TABS: 4.0000 mg | ORAL_TABLET | Freq: Three times a day (TID) | ORAL | Status: DC | PRN

## 2016-06-09 MED ORDER — LORAZEPAM 1 MG PO TABS: 1.0000 mg | ORAL_TABLET | Freq: Three times a day (TID) | ORAL | Status: DC | PRN

## 2016-06-09 MED ORDER — FLUOXETINE HCL 20 MG PO CAPS
40.0000 mg | ORAL_CAPSULE | Freq: Every day | ORAL | Status: DC
  Administered 2016-06-10: 40 mg via ORAL
  Filled 2016-06-09: qty 2

## 2016-06-09 NOTE — ED Provider Notes (Signed)
[facility]-EMERGENCY DEPT Provider Note   CSN: [id number] Arrival date & time: 06/09/16  2053  By signing my name below, I, Andrew Lynch, attest that this documentation has been prepared under the direction and in the presence of [name], PA. Electronically Signed: Alyssa GroveMartin Lynch, ED Scribe. 06/09/16. 10:12 PM.  History   Chief Complaint Chief Complaint  Patient presents with  . Suicidal   The history is provided by the patient. No language interpreter was used.  Mental Health Problem  Presenting symptoms: hallucinations and suicidal thoughts   Onset quality:  Gradual Duration: years. Timing:  Constant Progression:  Worsening Chronicity:  Recurrent Context: not alcohol use, not drug abuse and not noncompliant   Treatment compliance:  All of the time Time since last psychoactive medication taken:  12 hours Relieved by:  None tried Worsened by:  Nothing Ineffective treatments:  None tried Associated symptoms: no abdominal pain and no chest pain   Risk factors: hx of mental illness and hx of suicide attempts    HPI Comments: Andrew Lynch is a 20 y.o. male with a PMHx of ADHD and depression, who presents to the Emergency Department complaining of suicidal ideations with a plan onset "a long time ago".  Pt has a specific plan utilizing a knife and pills. Pt has had 2 suicide attempts prior to today. Pt reports associated auditory hallucinations, which involve occasionally hearing his name called. Denies illegal drug use or alcohol use. Pt is currently taking 40 mg Prozac daily and 10 mg Buspar 2x daily and pt is compliant with both medications, took them this morning. Nonsmoker. He denies homicidal ideations, visual hallucinations, fever chills, CP, shortness of breath, abd pain, nausea, vomiting, diarrhea, constipation, dysuria, hematuria, rashes, numbness, tingling, weakness, myalgias, or any other symptoms. Here voluntarily.  Past Medical History:  Diagnosis Date  .  ADHD (attention deficit hyperactivity disorder)   . Goiter   . Movement disorder   . Physical growth delay   . Poor appetite   . Puberty delay   . Stomach pain     Patient Active Problem List   Diagnosis Date Noted  . 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 Benign paroxysmal positional vertigo 02/21/2013  . Dyspepsia 02/21/2013  . Orthostatic hypotension 11/21/2012  . Physical growth delay   . ADHD (attention deficit hyperactivity disorder)   . Goiter   . Puberty delay   . Poor appetite   . Stomach pain   . Lack of expected normal physiological development in childhood 01/21/2011  . Goiter, unspecified 01/21/2011  . 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 No pertinent surgical history.   Home Medications    Prior to Admission medications   Medication Sig Start Date End Date Taking? Authorizing Provider  busPIRone (BUSPAR) 10 MG tablet Take 10 mg by mouth 2 (two) times daily. 01/20/16  Yes Historical Provider, MD  cetirizine (ZYRTEC) 10 MG tablet Take 10 mg by mouth daily.   Yes Historical Provider, MD  diphenhydrAMINE (BENADRYL) 25 MG tablet Take 1 tablet (25 mg total) by mouth every 6 (six) hours as needed for itching. 08/07/14  Yes Trixie DredgeEmily West, PA-C  FLUoxetine (PROZAC) 40 MG capsule Take 40 mg by mouth daily.   Yes Historical Provider, MD  fluticasone (FLONASE) 50 MCG/ACT nasal spray Place 1 spray into both nostrils daily.   Yes Historical Provider, MD  hyoscyamine (LEVSIN SL) 0.125 MG SL tablet Take 1 tablet by mouth every 6 (six) hours as  needed for cramping. 05/26/16  Yes Historical Provider, MD  omeprazole (PRILOSEC) 20 MG capsule Take 20 mg by mouth daily. 06/09/16 06/09/17 Yes Historical Provider, MD  cephALEXin (KEFLEX) 500 MG capsule Take 1 capsule (500 mg total) by mouth 4 (four) times daily. Patient not taking: Reported on 06/09/2016 09/11/14   Emilia Beck, PA-C  cyproheptadine (PERIACTIN) 4 MG tablet  Take 2 tablets (8mg ) Twice daily Patient not taking: Reported on 06/09/2016 12/14/13   David Stall, MD  diclofenac (VOLTAREN) 50 MG EC tablet Take 1 tablet (50 mg total) by mouth 2 (two) times daily. Patient not taking: Reported on 06/09/2016 11/28/15   Janne Napoleon, NP  HYDROcodone-acetaminophen (NORCO/VICODIN) 5-325 MG per tablet Take 1 tablet by mouth every 6 (six) hours as needed for moderate pain or severe pain. Patient not taking: Reported on 06/09/2016 12/08/14   Dahlia Client Muthersbaugh, PA-C  HYDROcodone-acetaminophen (NORCO/VICODIN) 5-325 MG tablet Take 2 tablets by mouth every 4 (four) hours as needed. Patient not taking: Reported on 06/09/2016 08/13/15   Samantha Tripp Dowless, PA-C  methocarbamol (ROBAXIN) 500 MG tablet Take 1 tablet (500 mg total) by mouth 2 (two) times daily. Patient not taking: Reported on 06/09/2016 11/28/15   Janne Napoleon, NP  permethrin (ELIMITE) 5 % cream Apply to from neck to toes, even under fingernails. Apply before bedtime, leave on 12 hours and rinse off in the morning. If you are still itching in 14 days from application, please repeat Patient not taking: Reported on 06/09/2016 11/08/14   Joni Reining Pisciotta, PA-C  predniSONE (DELTASONE) 10 MG tablet Take 2 tablets (20 mg total) by mouth 2 (two) times daily with a meal. Patient not taking: Reported on 06/09/2016 05/07/16   Janne Napoleon, NP  traMADol (ULTRAM) 50 MG tablet Take 1 tablet (50 mg total) by mouth every 6 (six) hours as needed. Patient not taking: Reported on 06/09/2016 09/11/14   Emilia Beck, PA-C    Family History Family History  Problem Relation Age of Onset  . Diabetes Maternal Grandfather   . Diabetes Paternal Uncle   . Thyroid disease Neg Hx     Social History Social History  Substance Use Topics  . Smoking status: Never Smoker  . Smokeless tobacco: Never Used  . Alcohol use No     Allergies   Review of patient's allergies indicates no known allergies.   Review of  Systems Review of Systems  Constitutional: Negative for chills and fever.  Respiratory: Negative for shortness of breath.   Cardiovascular: Negative for chest pain.  Gastrointestinal: Negative for abdominal pain, constipation, diarrhea, nausea and vomiting.  Genitourinary: Negative for dysuria and hematuria.  Musculoskeletal: Negative for arthralgias and myalgias.  Skin: Negative for rash.  Allergic/Immunologic: Negative for immunocompromised state.  Neurological: Negative for weakness and numbness.  Psychiatric/Behavioral: Positive for hallucinations and suicidal ideas. Negative for confusion.  10 Systems reviewed and are negative for acute change except as noted in the HPI.   Physical Exam Updated Vital Signs BP 138/86 (BP Location: Left Arm)   Pulse 84   Temp 98.2 F (36.8 C) (Oral)   Resp 18   Ht 5\' 2"  (1.575 m)   Wt 135 lb (61.2 kg)   SpO2 98%   BMI 24.69 kg/m   Physical Exam  Constitutional: He is oriented to person, place, and time. Vital signs are normal. He appears well-developed and well-nourished.  Non-toxic appearance. No distress.  Afebrile, nontoxic, NAD  HENT:  Head: Normocephalic and atraumatic.  Mouth/Throat: Oropharynx is  clear and moist and mucous membranes are normal.  Eyes: Conjunctivae and EOM are normal. Right eye exhibits no discharge. Left eye exhibits no discharge.  Neck: Normal range of motion. Neck supple.  Cardiovascular: Normal rate, regular rhythm, normal heart sounds and intact distal pulses.  Exam reveals no gallop and no friction rub.   No murmur heard. Pulmonary/Chest: Effort normal and breath sounds normal. No respiratory distress. He has no decreased breath sounds. He has no wheezes. He has no rhonchi. He has no rales.  Abdominal: Soft. Normal appearance and bowel sounds are normal. He exhibits no distension. There is no tenderness. There is no rigidity, no rebound, no guarding, no CVA tenderness, no tenderness at McBurney's point and  negative Murphy's sign.  Musculoskeletal: Normal range of motion.  Neurological: He is alert and oriented to person, place, and time. He has normal strength. No sensory deficit.  Skin: Skin is warm, dry and intact. No rash noted.  Psychiatric: His speech is normal and behavior is normal. He is actively hallucinating. He exhibits a depressed mood. He expresses suicidal ideation. He expresses no homicidal ideation. He expresses suicidal plans. He expresses no homicidal plans.  Pleasant and cooperative, endorsing SI with a plan, slightly depressed, endorsing auditory hallucinations but no visual hallucinations.  Nursing note and vitals reviewed.  ED Treatments / Results  DIAGNOSTIC STUDIES: Oxygen Saturation is 98% on RA, normal by my interpretation.    COORDINATION OF CARE: 10:02 PM Discussed treatment plan with pt at bedside which includes consult to TTS and Buspar and pt agreed to plan.  Labs (all labs ordered are listed, but only abnormal results are displayed) Labs Reviewed  COMPREHENSIVE METABOLIC PANEL - Abnormal; Notable for the following:       Result Value   Potassium 3.0 (*)    Glucose, Bld 145 (*)    Total Protein 8.5 (*)    Albumin 5.1 (*)    ALT 14 (*)    All other components within normal limits  ACETAMINOPHEN LEVEL - Abnormal; Notable for the following:    Acetaminophen (Tylenol), Serum <10 (*)    All other components within normal limits  CBC - Abnormal; Notable for the following:    MCHC 36.2 (*)    All other components within normal limits  ETHANOL  SALICYLATE LEVEL  RAPID URINE DRUG SCREEN, HOSP PERFORMED    EKG  EKG Interpretation None       Radiology No results found.  Procedures Procedures (including critical care time)  Medications Ordered in ED Medications  alum & mag hydroxide-simeth (MAALOX/MYLANTA) 200-200-20 MG/5ML suspension 30 mL (not administered)  ondansetron (ZOFRAN) tablet 4 mg (not administered)  zolpidem (AMBIEN) tablet 5 mg (not  administered)  ibuprofen (ADVIL,MOTRIN) tablet 600 mg (not administered)  acetaminophen (TYLENOL) tablet 650 mg (not administered)  LORazepam (ATIVAN) tablet 1 mg (not administered)  busPIRone (BUSPAR) tablet 10 mg (not administered)  loratadine (CLARITIN) tablet 10 mg (not administered)  diphenhydrAMINE (BENADRYL) capsule 25 mg (not administered)  FLUoxetine (PROZAC) capsule 40 mg (not administered)  fluticasone (FLONASE) 50 MCG/ACT nasal spray 1 spray (not administered)  hyoscyamine (LEVSIN SL) SL tablet 0.125 mg (not administered)  pantoprazole (PROTONIX) EC tablet 40 mg (not administered)  potassium chloride SA (K-DUR,KLOR-CON) CR tablet 60 mEq (not administered)     Initial Impression / Assessment and Plan / ED Course  I have reviewed the triage vital signs and the nursing notes.  Pertinent labs & imaging results that were available during my care of the  patient were reviewed by me and considered in my medical decision making (see chart for details).  Clinical Course   20 y.o. male here with SI with a plan, and AH, no VH/HI, no EtOH or drug use, nonsmoker. Here voluntarily. No other complaints. Awaiting labs, will reassess after labs return.  10:34 PM EtOH undetectable. CBC w/diff WNL. CMP with mildly low K 3.0 which pt states is chronic, will give kdur here. Salicylate and acetaminophen levels WNL. UDS neg. Pt medically cleared at this time. Psych hold orders and home med orders placed. Please see TTS notes for further documentation of care/dispo. PLEASE NOTE THAT PT IS HERE VOLUNTARILY AT THIS TIME, IF PT TRIES TO LEAVE THEY WOULD NEED IVC PAPERWORK TAKEN OUT. Pt stable at time of med clearance.    Final Clinical Impressions(s) / ED Diagnoses   Final diagnoses:  Suicidal ideation  Auditory hallucination  Hypokalemia    New Prescriptions New Prescriptions   No medications on file     Allen Derry, PA-C 06/09/16 2238    Lyndal Pulley, MD 06/10/16 [phone]

## 2016-06-09 NOTE — ED Notes (Signed)
Patient has on ankle monitoring bracelet. Bracelet charge for brief moment.

## 2016-06-09 NOTE — ED Notes (Signed)
Bed: [facility] Expected date:  Expected time:  Means of arrival:  Comments: 

## 2016-06-09 NOTE — ED Triage Notes (Signed)
Pt sent to ED by therapist for suicidal thoughts. Pt endorses a specific plan, but will not go in to detail. Endorses auditory hallucinations (just his name being called). Denies visual hallucinations. Hx 2 prior suicide attempts. Denies drug or alcohol use.

## 2016-06-09 NOTE — ED Notes (Signed)
Patient escorted to shower for safety check for contraband. No contraband found.

## 2016-06-09 NOTE — ED Notes (Signed)
Patient reports SI times 3 weeks. Patient reports that he had a planned to cut himself so he had all the knives removed. Patient also stated at work he thoughts of over dosing on stomach medication but reported that his co-workers wouldn't allow him to go home so he was unable to obtain the pills. Patient reports he saw his counselor who recommended he get treatment. Patient denies HI and AVH. Patient reports his mother, and school is his stressors. Plan of care discussed and patient verbalized understanding. Encouragement and support provided and safety maintain. Q 15 min safety checks in place and video monitoring.

## 2016-06-10 DIAGNOSIS — F331 Major depressive disorder, recurrent, moderate: Secondary | ICD-10-CM | POA: Diagnosis not present

## 2016-06-10 DIAGNOSIS — Z833 Family history of diabetes mellitus: Secondary | ICD-10-CM

## 2016-06-10 DIAGNOSIS — Z79899 Other long term (current) drug therapy: Secondary | ICD-10-CM

## 2016-06-10 NOTE — BH Assessment (Addendum)
Tele Assessment Note   Janus MolderRichard Austin Lynch is an 20 y.o. male.  -clinician reviewed note by Andrew Lynch.  Andrew Lynch is a 20 y.o. male with a PMHx of ADHD and depression, who presents to the Emergency Department complaining of suicidal ideations with a plan onset "a long time ago".  Pt has a specific plan utilizing a knife and pills. Pt has had 2 suicide attempts prior to today. Pt reports associated auditory hallucinations, which involve occasionally hearing his name called. Denies illegal drug use or alcohol use. Pt is currently taking 40 mg Prozac daily and 10 mg Buspar 2x daily and pt is compliant with both medications, took them this morning. Nonsmoker. He denies homicidal ideations, visual hallucinations   Patient on Friday (10/27) had the thought of killing himself.  He had planned to overdose or use a knife to kill himself.  Patient said that he had been thinking of killing himself for awhile.  This was the first time that it had gotten to the stage of planning it and having the materials with him.  Patient denies any HI or visual hallucinations.  Patient explains that he feels that he is left out of things with his family and that they do not care about him.  He felt unwanted and unloved.  Patient has a ankle monitor which limits his ability to go to far beyond what the monitor is programmed for.  Patient admits to sometimes hearing a voice call his name.  Patient denies any use of ETOH or illicit drugs.  Patient has a Veterinary surgeoncounselor through the court system.  That counselor is who told him to come to hospital for assessment.  Patient has no previous inpatient psychiatric care.  -Clinician discussed patient care with Andrew SievertSpencer Simon, PA who recommends an AM psych eval for patient.  Clinician informed Andrew Lynch, [facility].  Diagnosis: MDD, single episode,  Past Medical History:  Past Medical History:  Diagnosis Date  . ADHD (attention deficit  hyperactivity disorder)   . Goiter   . Movement disorder   . Physical growth delay   . Poor appetite   . Puberty delay   . Stomach pain     History reviewed. No pertinent surgical history.  Family History:  Family History  Problem Relation Age of Onset  . Diabetes Maternal Grandfather   . Diabetes Paternal Uncle   . Thyroid disease Neg Hx     Social History:  reports that he has never smoked. He has never used smokeless tobacco. He reports that he does not drink alcohol or use drugs.  Additional Social History:  Alcohol / Drug Use Pain Medications: None Prescriptions: Has medications but has not had them refilled because he has not gone back to primary care physician until yesterday. Over the Counter: None History of alcohol / drug use?: No history of alcohol / drug abuse  CIWA: CIWA-Ar BP: 125/87 Pulse Rate: 84 COWS:    PATIENT STRENGTHS: (choose at least two) Ability for insight Average or above average intelligence Communication skills Supportive family/friends  Allergies: No Known Allergies  Home Medications:  (Not in a hospital admission)  OB/GYN Status:  No LMP for male patient.  General Assessment Data Location of Assessment: [facility] TTS Assessment: In system Is this a Tele or Face-to-Face Assessment?: Face-to-Face Is this an Initial Assessment or a Re-assessment for this encounter?: Initial Assessment Marital status: Single Is patient pregnant?: No Pregnancy Status: No Living Arrangements: Other relatives (Lives with grandparents)  Can pt return to current living arrangement?: Yes Admission Status: Voluntary Is patient capable of signing voluntary admission?: Yes Referral Source: Self/Family/Friend Insurance type: MCD     Crisis Care Plan Living Arrangements: Other relatives (Lives with grandparents) Name of Psychiatrist: None Name of Therapist: Counselor through probationoffice  Education Status Is patient currently in school?: Yes Current  Grade: At [employer] Highest grade of school patient has completed: Trying to get GED  Risk to self with the past 6 months Suicidal Ideation: No-Not Currently/Within Last 6 Months Has patient been a risk to self within the past 6 months prior to admission? : Yes Suicidal Intent: No-Not Currently/Within Last 6 Months Has patient had any suicidal intent within the past 6 months prior to admission? : Yes Is patient at risk for suicide?: No Suicidal Plan?: No-Not Currently/Within Last 6 Months Has patient had any suicidal plan within the past 6 months prior to admission? : Yes Access to Means: Yes Specify Access to Suicidal Means: Take pills What has been your use of drugs/alcohol within the last 12 months?: None Previous Attempts/Gestures: No How many times?: 0 Other Self Harm Risks: None Triggers for Past Attempts: None known Intentional Self Injurious Behavior: None Family Suicide History: No (Mother has attempted.) Recent stressful life event(s): Conflict (Comment), Financial Problems (Felt like no one cared for him.) Persecutory voices/beliefs?: No Depression: Yes Depression Symptoms: Despondent, Guilt, Loss of interest in usual pleasures, Feeling worthless/self pity, Isolating Substance abuse history and/or treatment for substance abuse?: No Suicide prevention information given to non-admitted patients: Not applicable  Risk to Others within the past 6 months Homicidal Ideation: No Does patient have any lifetime risk of violence toward others beyond the six months prior to admission? : No Thoughts of Harm to Others: No Current Homicidal Intent: No Current Homicidal Plan: No Access to Homicidal Means: No Identified Victim: No one History of harm to others?: No Assessment of Violence: None Noted Violent Behavior Description: Pt says "I don't fight." Does patient have access to weapons?: No Criminal Charges Pending?: No Does patient have a court date: No Is patient on probation?: Yes  (Pt wears ankle monitor.)  Psychosis Hallucinations: Auditory (Will hear his name being called) Delusions: None noted  Mental Status Report Appearance/Hygiene: Body odor, Disheveled, In scrubs Eye Contact: Fair Motor Activity: Freedom of movement, Unremarkable Speech: Logical/coherent Level of Consciousness: Quiet/awake Mood: Depressed, Sad Affect: Appropriate to circumstance Anxiety Level: Minimal Thought Processes: Coherent, Relevant Judgement: Unimpaired Orientation: Appropriate for developmental age Obsessive Compulsive Thoughts/Behaviors: None  Cognitive Functioning Concentration: Normal Memory: Recent Intact, Remote Intact IQ: Average Insight: Fair Impulse Control: Fair Appetite: Poor Weight Loss: 0 Weight Gain: 0 Sleep: No Change Total Hours of Sleep: 8 Vegetative Symptoms: None  ADLScreening [facility] Assessment Services) Patient's cognitive ability adequate to safely complete daily activities?: Yes Patient able to express need for assistance with ADLs?: Yes Independently performs ADLs?: Yes (appropriate for developmental age)  Prior Inpatient Therapy Prior Inpatient Therapy: No Prior Therapy Dates: None Prior Therapy Facilty/Provider(s): N/A Reason for Treatment: N/A  Prior Outpatient Therapy Prior Outpatient Therapy: Yes Prior Therapy Dates: Current Prior Therapy Facilty/Provider(s[facility] Reason for Treatment: counselor Does patient have an ACCT team?: No Does patient have Intensive In-House Services?  : No Does patient have [facility] services? : No Does patient have P4CC services?: No  ADL Screening (condition at time of admission) Patient's cognitive ability adequate to safely complete daily activities?: Yes Is the patient deaf or have difficulty hearing?: No Does the patient have  difficulty seeing, even when wearing glasses/contacts?: No Does the patient have difficulty concentrating, remembering, or making decisions?: No Patient able to  express need for assistance with ADLs?: Yes Does the patient have difficulty dressing or bathing?: No Independently performs ADLs?: Yes (appropriate for developmental age) Does the patient have difficulty walking or climbing stairs?: No Weakness of Legs: None Weakness of Arms/Hands: None       Abuse/Neglect Assessment (Assessment to be complete while patient is alone) Physical Abuse: Denies Verbal Abuse: Yes, past (Comment) (Pt says he was bullied at sch[employer].) Sexual Abuse: Denies Exploitation of patient/patient's resources: Denies Self-Neglect: Denies     Merchant navy officerAdvance Directives (For Healthcare) Does patient have an advance directive?: No Would patient like information on creating an advanced directive?: No - patient declined information    Additional Information 1:1 In Past 12 Months?: No CIRT Risk: No Elopement Risk: No Does patient have medical clearance?: Yes     Disposition:  Disposition Initial Assessment Completed for this Encounter: Yes Disposition of Patient: Other dispositions (Pt to be reviewed with PA)  Beatriz StallionHarvey, Anusha Claus Ray 06/10/2016 2:16 AM

## 2016-06-10 NOTE — BH Assessment (Signed)
[facility] Assessment Progress Note  At the request of Thedore MinsMojeed Akintayo, MD, this writer called pt's grandfather, Argie RammingRichard Black [phone]).  Call was placed at 11:14.  Mr Vedia CofferBlack confirms that pt lives with him, and that pt is under his supervision whenever the pt is home.  Pt maintains a routine of going to school, going to work, then going home.  He keeps his appointments with his outpatient providers regularly, and takes his medications as prescribed.  Mr Vedia CofferBlack is confident that pt will be safe in the household, and that he can monitor pt for safety.  Pt is welcome to return to the household.  After staffing these details with Dr Jannifer FranklinAkintayo, it has been determined that pt does not require psychiatric hospitalization at this time.  [facility] to continue treatment with his current outpatient providers.  This has been included in pt's discharge instructions.  Pt's nurse, Christen BameRonnie, has been notified.  Doylene Canninghomas Nivea Wojdyla, MA Triage Specialist [phone]
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
## 2016-06-10 NOTE — Discharge Instructions (Addendum)
Major Depressive Disorder Major depressive disorder is a mental illness. It also may be called clinical depression or unipolar depression. Major depressive disorder usually causes feelings of sadness, hopelessness, or helplessness. Some people with this disorder do not feel particularly sad but lose interest in doing things they used to enjoy (anhedonia). Major depressive disorder also can cause physical symptoms. It can interfere with work, school, relationships, and other normal everyday activities. The disorder varies in severity but is longer lasting and more serious than the sadness we all feel from time to time in our lives. Major depressive disorder often is triggered by stressful life events or major life changes. Examples of these triggers include divorce, loss of your job or home, a move, and the death of a family member or close friend. Sometimes this disorder occurs for no obvious reason at all. People who have family members with major depressive disorder or bipolar disorder are at higher risk for developing this disorder, with or without life stressors. Major depressive disorder can occur at any age. It may occur just once in your life (single episode major depressive disorder). It may occur multiple times (recurrent major depressive disorder). SYMPTOMS People with major depressive disorder have either anhedonia or depressed mood on nearly a daily basis for at least 2 weeks or longer. Symptoms of depressed mood include:  Feelings of sadness (blue or down in the dumps) or emptiness.  Feelings of hopelessness or helplessness.  Tearfulness or episodes of crying (may be observed by others).  Irritability (children and adolescents). In addition to depressed mood or anhedonia or both, people with this disorder have at least four of the following symptoms:  Difficulty sleeping or sleeping too much.   Significant change (increase or decrease) in appetite or weight.   Lack of energy or  motivation.  Feelings of guilt and worthlessness.   Difficulty concentrating, remembering, or making decisions.  Unusually slow movement (psychomotor retardation) or restlessness (as observed by others).   Recurrent wishes for death, recurrent thoughts of self-harm (suicide), or a suicide attempt. People with major depressive disorder commonly have persistent negative thoughts about themselves, other people, and the world. People with severe major depressive disorder may experiencedistorted beliefs or perceptions about the world (psychotic delusions). They also may see or hear things that are not real (psychotic hallucinations). DIAGNOSIS Major depressive disorder is diagnosed through an assessment by your health care provider. Your health care provider will ask aboutaspects of your daily life, such as mood,sleep, and appetite, to see if you have the diagnostic symptoms of major depressive disorder. Your health care provider may ask about your medical history and use of alcohol or drugs, including prescription medicines. Your health care provider also may do a physical exam and blood work. This is because certain medical conditions and the use of certain substances can cause major depressive disorder-like symptoms (secondary depression). Your health care provider also may refer you to a mental health specialist for further evaluation and treatment. TREATMENT It is important to recognize the symptoms of major depressive disorder and seek treatment. The following treatments can be prescribed for this disorder:   Medicine. Antidepressant medicines usually are prescribed. Antidepressant medicines are thought to correct chemical imbalances in the brain that are commonly associated with major depressive disorder. Other types of medicine may be added if the symptoms do not respond to antidepressant medicines alone or if psychotic delusions or hallucinations occur.  Talk therapy. Talk therapy can be  helpful in treating major depressive disorder by providing  support, education, and guidance. Certain types of talk therapy also can help with negative thinking (cognitive behavioral therapy) and with relationship issues that trigger this disorder (interpersonal therapy). A mental health specialist can help determine which treatment is best for you. Most people with major depressive disorder do well with a combination of medicine and talk therapy. Treatments involving electrical stimulation of the brain can be used in situations with extremely severe symptoms or when medicine and talk therapy do not work over time. These treatments include electroconvulsive therapy, transcranial magnetic stimulation, and vagal nerve stimulation.   This information is not intended to replace advice given to you by your health care provider. Make sure you discuss any questions you have with your health care provider.   INSTRUCTIONS: 1.Take all your medications as prescribed.  2. Report any adverse side effects to your medication to your outpatient provider. 3. Do not use alcohol or illegal drugs while taking prescription medications. 4. In the event of worsening symptoms, call 911, the crisis hotline or go to nearest emergency room for evaluation of symptoms.  Document Released: 11/21/2012 Document Revised: 08/17/2014 Document Reviewed: 11/21/2012 Elsevier Interactive Patient Education [employer]2016 [employer]. For your ongoing behavioral health needs, you are advised to continue treatment with Andrew Lynch, your outpatient provider:       Estanislado Spirehristie Lynch, [facility]      [facility]      [address]      [place], [place] [id number]      [phone]

## 2016-06-10 NOTE — [facility] Suicide Risk Assessment (Signed)
Suicide Risk Assessment  Discharge Assessment   [facility]   Principal Problem: MDD (major depressive disorder), recurrent episode, moderate (HCC) Discharge Diagnoses:  Patient Active Problem List   Diagnosis Date Noted  . MDD (major depressive disorder), recurrent episode, moderate (HCC) [F33.1] 06/10/2016  . 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 Benign paroxysmal positional vertigo [H81.10] 02/21/2013  . Dyspepsia [R10.13] 02/21/2013  . Orthostatic hypotension [I95.1] 11/21/2012  . 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 ADHD (attention deficit hyperactivity disorder) [F90.9]   . Goiter [E04.9]   . Puberty delay [E30.0]   . Poor appetite [R63.0]   . Stomach pain [R10.9]   . 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 Goiter, unspecified [E04.9] 01/21/2011  . 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 rate 16, height 5\' 2"  (1.575 m), weight 61.2 kg (135 lb), SpO2 99 %.Body mass index is 24.69 kg/m.   General Appearance: Fairly Groomed  Eye Contact:  Good  Speech:  Clear and Coherent and Normal Rate  Volume:  Normal  Mood:  Anxious  Affect:  Congruent  Thought Process:  Coherent and Goal Directed  Orientation:  Full (Time, Place, and Person)  Thought Content:  Logical  Suicidal Thoughts:  No  Homicidal Thoughts:  No  Memory:  Immediate;   Good Recent;   Good  Judgement:  Intact  Insight:  Present  Psychomotor Activity:  Normal  Concentration:  Concentration: Good and Attention Span: Good  Recall:  Good  Fund of Knowledge:  Good  Language:  Good  Akathisia:  No  Handed:  Right   AIMS (if indicated):     Assets:  Communication Skills Desire for Improvement Physical Health Resilience Social Support Vocational/Educational  ADL's:  Intact  Cognition:  WNL  Sleep:      Mental Status Per Nursing Assessment::   On Admission:     Demographic Factors:  Male, Adolescent or young adult and Caucasian  Loss Factors: Legal issues  Historical Factors: NA  Risk Reduction Factors:   Employed, Living with another person, especially a relative, Positive social support and Positive therapeutic relationship  Continued Clinical Symptoms:  Depression, mild  Cognitive Features That Contribute To Risk:  None    Suicide Risk:  Minimal: No identifiable suicidal ideation.  Patients presenting with no risk factors but with morbid ruminations; may be classified as minimal risk based on the severity of the depressive symptoms     Plan Of Care/Follow-up recommendations:  Activity:  as tolerated Diet:  regular Tests:  as determined by PCP  1.Take all your medications as prescribed.  2. Report any adverse side effects to your medication to your outpatient provider. 3. Do not use alcohol or illegal drugs while taking prescription medications. 4. In the event of worsening symptoms, call 911, the crisis hotline or go to nearest emergency room for evaluation of symptoms.  Alberteen SamFran Yasira Engelson, FNP-BC [facility] 06/10/2016, 12:06 PM

## 2017-05-01 DIAGNOSIS — J301 Allergic rhinitis due to pollen: Secondary | ICD-10-CM | POA: Insufficient documentation

## 2017-05-01 DIAGNOSIS — L700 Acne vulgaris: Secondary | ICD-10-CM | POA: Insufficient documentation

## 2018-01-23 ENCOUNTER — Emergency Department (HOSPITAL_COMMUNITY)
Admission: EM | Admit: 2018-01-23 | Discharge: 2018-01-23 | Disposition: A | Discharge status: Home / Self Care | Payer: Self-pay | Attending: Emergency Medicine | Admitting: Emergency Medicine

## 2018-01-23 ENCOUNTER — Other Ambulatory Visit: Payer: Self-pay

## 2018-01-23 ENCOUNTER — Encounter (HOSPITAL_COMMUNITY): Payer: Self-pay | Admitting: *Deleted

## 2018-01-23 DIAGNOSIS — Z79899 Other long term (current) drug therapy: Secondary | ICD-10-CM | POA: Insufficient documentation

## 2018-01-23 DIAGNOSIS — L259 Unspecified contact dermatitis, unspecified cause: Secondary | ICD-10-CM | POA: Insufficient documentation

## 2018-01-23 DIAGNOSIS — F909 Attention-deficit hyperactivity disorder, unspecified type: Secondary | ICD-10-CM | POA: Insufficient documentation

## 2018-01-23 MED ORDER — PREDNISONE 20 MG PO TABS
60.0000 mg | ORAL_TABLET | Freq: Once | ORAL | Status: AC
  Administered 2018-01-23: 60 mg via ORAL
  Filled 2018-01-23: qty 3

## 2018-01-23 MED ORDER — PREDNISONE 20 MG PO TABS: 40.0000 mg | ORAL_TABLET | Freq: Every day | ORAL | 0 refills | Status: DC

## 2018-01-23 NOTE — ED Notes (Signed)
Pt verbalized understanding discharge instructions and denies any further needs or questions at this time. VS stable, ambulatory and steady gait.   See EDP assessment / note.   

## 2018-01-23 NOTE — ED Triage Notes (Signed)
Pt was exposed to poison oak  Yesterday and today has rash to limbs and abdomen.  Grandfather states pt has allergic reaction every time and needs to be given steroids.

## 2018-01-23 NOTE — ED Provider Notes (Signed)
[facility] EMERGENCY DEPARTMENT Provider Note   CSN: [id number] Arrival date & time: 01/23/18  1542     History   Chief Complaint Chief Complaint  Patient presents with  . Rash    HPI Andrew Lynch is a 22 y.o. male who presents with a rash.  The patient states that he was helping a neighbor cut back some brush yesterday and started to develop a scattered rash on his body.  He thinks it is poison oak.  He states he typically gets severe reactions to poison oak and will get a severe generalized rash.  He is taken steroids for the rash in the past which has cleared it up.  He is requesting steroids today.  He denies throat swelling, difficulty swallowing, shortness of breath or wheezing.  HPI  Past Medical History:  Diagnosis Date  . ADHD (attention deficit hyperactivity disorder)   . Goiter   . Movement disorder   . Physical growth delay   . Poor appetite   . Puberty delay   . Stomach pain     Patient Active Problem List   Diagnosis Date Noted  . MDD (major depressive disorder), recurrent episode, moderate (HCC) 06/10/2016  . 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 Benign paroxysmal positional vertigo 02/21/2013  . Dyspepsia 02/21/2013  . Orthostatic hypotension 11/21/2012  . Physical growth delay   . ADHD (attention deficit hyperactivity disorder)   . Goiter   . Puberty delay   . Poor appetite   . Stomach pain   . Lack of expected normal physiological development in childhood 01/21/2011  . Goiter, unspecified 01/21/2011  . 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 No pertinent surgical history.      Home Medications    Prior to Admission medications   Medication Sig Start Date End Date Taking? Authorizing Provider  busPIRone (BUSPAR) 10 MG tablet Take 10 mg by mouth 2 (two) times daily. 01/20/16   [provider]  cephALEXin (KEFLEX) 500 MG capsule Take 1 capsule (500  mg total) by mouth 4 (four) times daily. Patient not taking: Reported on 06/09/2016 09/11/14   Emilia Beck, PA-C  cetirizine (ZYRTEC) 10 MG tablet Take 10 mg by mouth daily.    [provider]  cyproheptadine (PERIACTIN) 4 MG tablet Take 2 tablets (8mg ) Twice daily Patient not taking: Reported on 06/09/2016 12/14/13   David Stall, MD  diclofenac (VOLTAREN) 50 MG EC tablet Take 1 tablet (50 mg total) by mouth 2 (two) times daily. Patient not taking: Reported on 06/09/2016 11/28/15   Janne Napoleon, NP  diphenhydrAMINE (BENADRYL) 25 MG tablet Take 1 tablet (25 mg total) by mouth every 6 (six) hours as needed for itching. 08/07/14   Trixie Dredge, PA-C  FLUoxetine (PROZAC) 40 MG capsule Take 40 mg by mouth daily.    [provider]  fluticasone (FLONASE) 50 MCG/ACT nasal spray Place 1 spray into both nostrils daily.    [provider]  HYDROcodone-acetaminophen (NORCO/VICODIN) 5-325 MG per tablet Take 1 tablet by mouth every 6 (six) hours as needed for moderate pain or severe pain. Patient not taking: Reported on 06/09/2016 12/08/14   Muthersbaugh, Dahlia Client, PA-C  HYDROcodone-acetaminophen (NORCO/VICODIN) 5-325 MG tablet Take 2 tablets by mouth every 4 (four) hours as needed. Patient not taking: Reported on 06/09/2016 08/13/15   Dowless, Lelon Mast Tripp, PA-C  hyoscyamine (LEVSIN SL) 0.125 MG SL tablet Take 1 tablet by mouth every 6 (six) hours  as needed for cramping. 05/26/16   [provider]  methocarbamol (ROBAXIN) 500 MG tablet Take 1 tablet (500 mg total) by mouth 2 (two) times daily. Patient not taking: Reported on 06/09/2016 11/28/15   Janne Napoleon, NP  omeprazole (PRILOSEC) 20 MG capsule Take 20 mg by mouth daily. 06/09/16 06/09/17  [provider]  permethrin (ELIMITE) 5 % cream Apply to from neck to toes, even under fingernails. Apply before bedtime, leave on 12 hours and rinse off in the morning. If you are still itching in 14 days from  application, please repeat Patient not taking: Reported on 06/09/2016 11/08/14   Pisciotta, Joni Reining, PA-C  predniSONE (DELTASONE) 10 MG tablet Take 2 tablets (20 mg total) by mouth 2 (two) times daily with a meal. Patient not taking: Reported on 06/09/2016 05/07/16   Janne Napoleon, NP  traMADol (ULTRAM) 50 MG tablet Take 1 tablet (50 mg total) by mouth every 6 (six) hours as needed. Patient not taking: Reported on 06/09/2016 09/11/14   Emilia Beck, PA-C    Family History Family History  Problem Relation Age of Onset  . Diabetes Maternal Grandfather   . Diabetes Paternal Uncle   . Thyroid disease Neg Hx     Social History Social History   Tobacco Use  . Smoking status: Never Smoker  . Smokeless tobacco: Never Used  Substance Use Topics  . Alcohol use: No  . Drug use: No     Allergies   Patient has no known allergies.   Review of Systems Review of Systems  Respiratory: Negative for shortness of breath.   Skin: Positive for rash.     Physical Exam Updated Vital Signs BP (!) 142/85 (BP Location: Right Arm)   Pulse 78   Temp 98.1 F (36.7 C) (Oral)   Resp 16   Ht 5\' 2"  (1.575 m)   Wt 61.2 kg (135 lb)   SpO2 98%   BMI 24.69 kg/m   Physical Exam  Constitutional: He is oriented to person, place, and time. He appears well-developed and well-nourished. No distress.  Calm and cooperative  HENT:  Head: Normocephalic and atraumatic.  Eyes: Pupils are equal, round, and reactive to light. Conjunctivae are normal. Right eye exhibits no discharge. Left eye exhibits no discharge. No scleral icterus.  Neck: Normal range of motion.  Cardiovascular: Normal rate.  Pulmonary/Chest: Effort normal. No respiratory distress.  Abdominal: He exhibits no distension.  Neurological: He is alert and oriented to person, place, and time.  Skin: Skin is warm and dry. Rash (Scattered erythematous papules on the body consistent with a contact dermatitis) noted.  Psychiatric: He has a  normal mood and affect. His behavior is normal.  Nursing note and vitals reviewed.    ED Treatments / Results  Labs (all labs ordered are listed, but only abnormal results are displayed) Labs Reviewed - No data to display  EKG None  Radiology No results found.  Procedures Procedures (including critical care time)  Medications Ordered in ED Medications  predniSONE (DELTASONE) tablet 60 mg (has no administration in time range)     Initial Impression / Assessment and Plan / ED Course  I have reviewed the triage vital signs and the nursing notes.  Pertinent labs & imaging results that were available during my care of the patient were reviewed by me and considered in my medical decision making (see chart for details).  22 year old male presents with a rash consistent with contact dermatitis.  He is mildly hypertensive but  otherwise vital signs are normal.  No signs of a severe allergic reaction.  Will be given a prescription for steroids.  Final Clinical Impressions(s) / ED Diagnoses   Final diagnoses:  Contact dermatitis, unspecified contact dermatitis type, unspecified trigger    ED Discharge Orders    None       Bethel BornGekas, Tyee Vandevoorde Marie, PA-C 01/23/18 1715    Terrilee FilesButler, Michael C, MD 01/24/18 [phone]

## 2018-01-23 NOTE — Discharge Instructions (Signed)
Take steroid once a day for the next 4 days. You have received your first dose today so you can start your second dose tomorrow

## 2018-01-26 ENCOUNTER — Other Ambulatory Visit: Payer: Self-pay

## 2018-01-26 ENCOUNTER — Encounter (HOSPITAL_COMMUNITY): Payer: Self-pay

## 2018-01-26 ENCOUNTER — Emergency Department (HOSPITAL_COMMUNITY)
Admission: EM | Admit: 2018-01-26 | Discharge: 2018-01-26 | Disposition: A | Discharge status: Home / Self Care | Payer: Self-pay | Attending: Emergency Medicine | Admitting: Emergency Medicine

## 2018-01-26 DIAGNOSIS — Z79899 Other long term (current) drug therapy: Secondary | ICD-10-CM | POA: Insufficient documentation

## 2018-01-26 DIAGNOSIS — L255 Unspecified contact dermatitis due to plants, except food: Secondary | ICD-10-CM | POA: Insufficient documentation

## 2018-01-26 DIAGNOSIS — R21 Rash and other nonspecific skin eruption: Secondary | ICD-10-CM | POA: Insufficient documentation

## 2018-01-26 MED ORDER — DIPHENHYDRAMINE HCL 25 MG PO TABS: 25.0000 mg | ORAL_TABLET | Freq: Four times a day (QID) | ORAL | 0 refills | Status: DC | PRN

## 2018-01-26 MED ORDER — FAMOTIDINE 20 MG PO TABS: 20.0000 mg | ORAL_TABLET | Freq: Two times a day (BID) | ORAL | 0 refills | Status: DC | PRN

## 2018-01-26 MED ORDER — PREDNISONE 10 MG PO TABS: ORAL_TABLET | ORAL | 0 refills | Status: DC

## 2018-01-26 NOTE — ED Triage Notes (Signed)
Pt endorses having poison oak x 1 week, was here 3 days ago and given steroids and has been taking it since but poison oak has not resolved. VSS.

## 2018-01-26 NOTE — Discharge Instructions (Addendum)
Continue taking prednisone as prescribed.  You will end up taking this for 10 days total.  Take Benadryl every 6 hours as needed for itching.  You may also take Pepcid twice daily as needed for itching, do not take your omeprazole (Prilosec) while taking this medication.  You may continue using calamine lotion or taking oatmeal baths for further relief.  Try not to touch your hands and then touch other parts of your body which makes by the rash.  Follow-up with your primary care physician if symptoms persist.  Return to the emergency department if any concerning signs or symptoms develop such as fevers, difficulty breathing or swallowing, facial swelling, or vomiting.

## 2018-01-26 NOTE — ED Provider Notes (Signed)
[facility]   CSN: [id number] Arrival date & time: 01/26/18  2159     History   Chief Complaint Chief Complaint  Patient presents with  . Poison Ivy    HPI Andrew Lynch is a 22 y.o. male with history of ADHD, major depressive disorder presents for evaluation of acute onset, progressively worsening pruritic rash for 3 days.  He states that on Sunday he was helping a neighbor cut some brush outside and developed a rash the next day.  Rash is pruritic, primarily around the bilateral hands in between the digits but has also since spread to the lower extremities distally and right side of the abdomen and back.  No aggravating or alleviating factors noted.  He thinks it is poison oak.  He was seen and evaluated for the same symptoms 3 days ago and was discharged on a 4-day prednisone burst without significant relief of his symptoms.  He has also tried calamine lotion without significant relief.  He denies fevers, chills, nausea, vomiting, shortness of breath, swelling of the lips or tongue, chest pain, or abdominal pain.  Denies any new soaps, detergents, lotions, or shampoos.  The history is provided by the patient.    Past Medical History:  Diagnosis Date  . ADHD (attention deficit hyperactivity disorder)   . Goiter   . Movement disorder   . Physical growth delay   . Poor appetite   . Puberty delay   . Stomach pain     Patient Active Problem List   Diagnosis Date Noted  . MDD (major depressive disorder), recurrent episode, moderate (HCC) 06/10/2016  . 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 Benign paroxysmal positional vertigo 02/21/2013  . Dyspepsia 02/21/2013  . Orthostatic hypotension 11/21/2012  . Physical growth delay   . ADHD (attention deficit hyperactivity disorder)   . Goiter   . Puberty delay   . Poor appetite   . Stomach pain   . Lack of expected normal physiological development in childhood 01/21/2011    . Goiter, unspecified 01/21/2011  . 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 No pertinent surgical history.      Home Medications    Prior to Admission medications   Medication Sig Start Date End Date Taking? Authorizing Provider  busPIRone (BUSPAR) 10 MG tablet Take 10 mg by mouth 2 (two) times daily. 01/20/16   [provider]  cetirizine (ZYRTEC) 10 MG tablet Take 10 mg by mouth daily.    [provider]  diphenhydrAMINE (BENADRYL) 25 MG tablet Take 1 tablet (25 mg total) by mouth every 6 (six) hours as needed for itching. 01/26/18   Kamera Dubas A, PA-C  famotidine (PEPCID) 20 MG tablet Take 1 tablet (20 mg total) by mouth 2 (two) times daily as needed for indigestion (itching). 01/26/18   Marabella Popiel A, PA-C  FLUoxetine (PROZAC) 40 MG capsule Take 40 mg by mouth daily.    [provider]  fluticasone (FLONASE) 50 MCG/ACT nasal spray Place 1 spray into both nostrils daily.    [provider]  hyoscyamine (LEVSIN SL) 0.125 MG SL tablet Take 1 tablet by mouth every 6 (six) hours as needed for cramping. 05/26/16   [provider]  omeprazole (PRILOSEC) 20 MG capsule Take 20 mg by mouth daily. 06/09/16 06/09/17  [provider]  predniSONE (DELTASONE) 10 MG tablet Take 40 mg of prednisone in the morning with food for 3 days,  then 30 mg of prednisone for 2 days, then 20 mg of prednisone for 1 day. 01/26/18   Jeanie Sewer, PA-C    Family History Family History  Problem Relation Age of Onset  . Diabetes Maternal Grandfather   . Diabetes Paternal Uncle   . Thyroid disease Neg Hx     Social History Social History   Tobacco Use  . Smoking status: Never Smoker  . Smokeless tobacco: Never Used  Substance Use Topics  . Alcohol use: No  . Drug use: No     Allergies   Poison ivy extract and Poison oak extract   Review of Systems Review of Systems  Constitutional:  Negative for chills and fever.  HENT: Negative for facial swelling.   Respiratory: Negative for shortness of breath.   Cardiovascular: Negative for chest pain.  Gastrointestinal: Negative for abdominal pain, nausea and vomiting.  Skin: Positive for rash.     Physical Exam Updated Vital Signs BP (!) 142/92 (BP Location: Right Arm)   Pulse 90   Temp 98.2 F (36.8 C) (Oral)   Resp 16   Ht 5\' 2"  (1.575 m)   Wt 61.2 kg (135 lb)   SpO2 100%   BMI 24.69 kg/m   Physical Exam  Constitutional: He appears well-developed and well-nourished. No distress.  HENT:  Head: Normocephalic and atraumatic.  No angioedema, no uvular edema.  Tolerating secretions without difficulty.  No upper airway stridor.  Eyes: Conjunctivae are normal. Right eye exhibits no discharge. Left eye exhibits no discharge.  Neck: No JVD present. No tracheal deviation present.  Cardiovascular: Normal rate, regular rhythm and normal heart sounds.  Pulmonary/Chest: Effort normal and breath sounds normal. No respiratory distress. He has no wheezes.  Abdominal: Soft. Bowel sounds are normal. He exhibits no distension. There is no tenderness. There is no guarding.  Musculoskeletal: He exhibits no edema.  Neurological: He is alert.  Skin: Skin is warm and dry. Rash noted. No erythema.  See attached images.  There is an erythematous pruritic rash primarily localized to the bilateral hands and between the digits.  The rash is linear with vesicular appearing lesions.  Excoriations noted.  There are similar smaller lesions to the distal lower extremities and right lateral abdomen and right low back.  Psychiatric: He has a normal mood and affect. His behavior is normal.  Nursing note and vitals reviewed.        ED Treatments / Results  Labs (all labs ordered are listed, but only abnormal results are displayed) Labs Reviewed - No data to display  EKG None  Radiology No results found.  Procedures Procedures  (including critical care time)  Medications Ordered in ED Medications - No data to display   Initial Impression / Assessment and Plan / ED Course  I have reviewed the triage vital signs and the nursing notes.  Pertinent labs & imaging results that were available during my care of the patient were reviewed by me and considered in my medical decision making (see chart for details).     Rash consistent with poison ivy/poison oak dermatitis.  Patient is afebrile, nontoxic in appearance.  Patient denies any difficulty breathing or swallowing.  Pt has a patent airway without stridor and is handling secretions without difficulty; no angioedema. No blisters, no pustules, no warmth, no draining sinus tracts, no superficial abscesses, no bullous impetigo, no desquamation, no target lesions with dusky purpura or a central bulla. Not tender to touch. No concern for superimposed infection. No  concern for SJS, TEN, TSS, tick borne illness, syphilis or other life-threatening condition.  He will require a longer course of steroids and will also add Benadryl and Pepcid as needed for itching.  Also recommend barrier protection with calamine lotion, oatmeal baths as needed for comfort.  Recommend follow-up with PCP or dermatology.  Discussed strict ED return precautions.  Patient and patient's grandfather verbalized understanding of plan and patient stable for discharge home at this time.   Final Clinical Impressions(s) / ED Diagnoses   Final diagnoses:  Contact dermatitis due to plant    ED Discharge Orders        Ordered    predniSONE (DELTASONE) 10 MG tablet     01/26/18 2326    diphenhydrAMINE (BENADRYL) 25 MG tablet  Every 6 hours PRN     01/26/18 2326    famotidine (PEPCID) 20 MG tablet  2 times daily PRN     01/26/18 2326       Jeanie Sewer, PA-C 01/26/18 2331    Benjiman Core, MD 01/26/18 2345

## 2019-12-25 ENCOUNTER — Encounter: Payer: Self-pay | Admitting: Neurology

## 2020-03-11 NOTE — Progress Notes (Signed)
NEUROLOGY CONSULTATION NOTE  Andrew Lynch MRN: [id number] DOB: 12-11-1995  Referring provider: Georgann Housekeeper, MD Primary care provider: Georgann Housekeeper, MD  Reason for consult:  Headache, dizziness, fainting, question autonomic dysfunction  HISTORY OF PRESENT ILLNESS: Andrew Lynch is a 24 year old male with ADHD and goiter who presents for headache, dizziness, fainting, question autonomic dysfunction.  History supplemented by referring provider's note.   When he was 17-18, he has had episodes of dizziness, sometimes associated with fainting followed by headache. Usually it would occur after standing up but sometimes it would occur while standing or walking. It lasts a few seconds.  No associated palpitations, chest pain or shortness of breath.  It resolved and returned earlier this year.  It will be triggered with quick change in position or turning around quickly.  Dizziness is described as a lightheadedness.  Sometimes tunnel vision or blurred vision.  Afterwards, he feels a severe pounding pain in the back of his head with photophobia and phonophobia but rarely nausea.  No convulsions, tongue biting, incontinence or postictal confusion.  No tinnitus or hearing loss but sometimes has sharp pain in his ears.  Headaches last about an hour.  He had about 2 over the past month.  Since episodes returned, he has not passed out (only near syncope).  He drinks a lot of soda [facility], Dr. Reino Kent) but not water and doesn't eat much (maybe 2 meals a day).  He treats headaches with ibuprofen.  CBC was normal.  Chem profile unremarkable except for mildly low potassium of 3.3.  He does reports occasional classic migraines. No history of cardiac issues at a young age.     PAST MEDICAL HISTORY: Past Medical History:  Diagnosis Date  . ADHD (attention deficit hyperactivity disorder)   . Goiter   . Movement disorder   . Physical growth delay   . Poor appetite   . Puberty delay   . Stomach pain       PAST SURGICAL HISTORY: No past surgical history on file.  MEDICATIONS: Current Outpatient Medications on File Prior to Visit  Medication Sig Dispense Refill  . busPIRone (BUSPAR) 10 MG tablet Take 10 mg by mouth 2 (two) times daily.    . cetirizine (ZYRTEC) 10 MG tablet Take 10 mg by mouth daily.    . diphenhydrAMINE (BENADRYL) 25 MG tablet Take 1 tablet (25 mg total) by mouth every 6 (six) hours as needed for itching. 30 tablet 0  . famotidine (PEPCID) 20 MG tablet Take 1 tablet (20 mg total) by mouth 2 (two) times daily as needed for indigestion (itching). 30 tablet 0  . FLUoxetine (PROZAC) 40 MG capsule Take 40 mg by mouth daily.    . fluticasone (FLONASE) 50 MCG/ACT nasal spray Place 1 spray into both nostrils daily.    . hyoscyamine (LEVSIN SL) 0.125 MG SL tablet Take 1 tablet by mouth every 6 (six) hours as needed for cramping.    Marland Kitchen omeprazole (PRILOSEC) 20 MG capsule Take 20 mg by mouth daily.    . predniSONE (DELTASONE) 10 MG tablet Take 40 mg of prednisone in the morning with food for 3 days, then 30 mg of prednisone for 2 days, then 20 mg of prednisone for 1 day. 20 tablet 0   No current facility-administered medications on file prior to visit.    ALLERGIES: Allergies  Allergen Reactions  . Poison Ivy Extract Rash  . Poison Oak Extract Rash    FAMILY HISTORY: Family History  Problem Relation Age of Onset  . Diabetes Maternal Grandfather   . Diabetes Paternal Uncle   . Thyroid disease Neg Hx     SOCIAL HISTORY: Social History   Socioeconomic History  . Marital status: Single    Spouse name: Not on file  . Number of children: Not on file  . Years of education: Not on file  . Highest education level: Not on file  Occupational History  . Not on file  Tobacco Use  . Smoking status: Never Smoker  . Smokeless tobacco: Never Used  Vaping Use  . Vaping Use: Former  Substance and Sexual Activity  . Alcohol use: No  . Drug use: No  . Sexual activity: Not  Currently  Other Topics Concern  . Not on file  Social History Narrative  . Not on file   Social Determinants of Health   Financial Resource Strain:   . Difficulty of Paying Living Expenses:   Food Insecurity:   . Worried About Programme researcher, broadcasting/film/video in the Last Year:   . Barista in the Last Year:   Transportation Needs:   . Freight forwarder (Medical):   Marland Kitchen Lack of Transportation (Non-Medical):   Physical Activity:   . Days of Exercise per Week:   . Minutes of Exercise per Session:   Stress:   . Feeling of Stress :   Social Connections:   . Frequency of Communication with Friends and Family:   . Frequency of Social Gatherings with Friends and Family:   . Attends Religious Services:   . Active Member of Clubs or Organizations:   . Attends Banker Meetings:   Marland Kitchen Marital Status:   Intimate Partner Violence:   . Fear of Current or Ex-Partner:   . Emotionally Abused:   Marland Kitchen Physically Abused:   . Sexually Abused:     PHYSICAL EXAM: BP 112/78, Pulse 57 Height 5\' 2"  (1.575 m), weight 167 lb (75.8 kg), SpO2 98 %. General: No acute distress.  Patient appears well-groomed.   Head:  Normocephalic/atraumatic Eyes:  fundi examined but not visualized Neck: supple, no paraspinal tenderness, full range of motion Back: No paraspinal tenderness Heart: regular rate and rhythm Lungs: Clear to auscultation bilaterally. Vascular: No carotid bruits. Neurological Exam: Mental status: alert and oriented to person, place, and time, recent and remote memory intact, fund of knowledge intact, attention and concentration intact, speech fluent and not dysarthric, language intact. Cranial nerves: CN I: not tested CN II: pupils equal, round and reactive to light, visual fields intact CN III, IV, VI:  full range of motion, no nystagmus, no ptosis CN V: facial sensation intact CN VII: upper and lower face symmetric CN VIII: hearing intact CN IX, X: gag intact, uvula midline CN  XI: sternocleidomastoid and trapezius muscles intact CN XII: tongue midline Bulk & Tone: normal, no fasciculations. Motor:  5/5 throughout  Sensation:  Pinprick and vibration sensation intact. Deep Tendon Reflexes:  2+ throughout,  toes downgoing.  Finger to nose testing:  Without dysmetria.  Heel to shin:  Without dysmetria.  Gait:  Normal station and stride.  Able to turn and tandem walk. Romberg negative.  IMPRESSION: 1.  Postural near-syncope/past history of syncope, postural and sometimes spontaneous.   2.  Migraine without aura, without status migrainosus, not intractable Orthostatic vitals negative.  Given the headache, may be migraines.  Cannot rule out cardiac etiology as well.    PLAN: 1.  MRI of brain with and without  contrast 2.  EEG 3.  Start nortriptyline 10mg  at bedtime.  We can increase dose in 4 to 6 weeks if needed to 25mg  at bedtime 4.  Limit use of pain relievers to no more than 2 days out of week to prevent risk of rebound or medication-overuse headache. 5.  Advised to discontinue soda, increase water intake and maintain eating appropriate meals. 6.  If testing unremarkable, consider cardiac workup 7.  Follow up in 4 to 6 weeks  He has not actually passed out since these spells returned.  He also is able to safely get into a comfortable position if it does occur.  Therefore, I don't see any reason for driving restrictions.  Thank you for allowing me to take part in the care of this patient.  , DO  CC: , MD

## 2020-03-12 ENCOUNTER — Encounter: Payer: Self-pay | Admitting: Neurology

## 2020-03-12 ENCOUNTER — Other Ambulatory Visit: Payer: Self-pay

## 2020-03-12 ENCOUNTER — Ambulatory Visit (INDEPENDENT_AMBULATORY_CARE_PROVIDER_SITE_OTHER): Payer: 59 | Admitting: Neurology

## 2020-03-12 VITALS — Ht 62.0 in | Wt 167.0 lb

## 2020-03-12 DIAGNOSIS — G43009 Migraine without aura, not intractable, without status migrainosus: Secondary | ICD-10-CM

## 2020-03-12 DIAGNOSIS — R55 Syncope and collapse: Secondary | ICD-10-CM

## 2020-03-12 DIAGNOSIS — R4020 Unspecified coma: Secondary | ICD-10-CM

## 2020-03-12 MED ORDER — NORTRIPTYLINE HCL 10 MG PO CAPS
10.0000 mg | ORAL_CAPSULE | Freq: Every day | ORAL | 5 refills | Status: DC
Start: 2020-03-12 — End: 2023-04-02

## 2020-03-12 NOTE — Patient Instructions (Addendum)
  1. MRI of brain with and without contrast We have scheduled you at [facility] for your MRI. Please arrive 30 minutes prior and go to [address] [facility]. If you need to change this appt please call [phone]. 2. Routine EEG 3. Start nortriptyline 10mg  at bedtime.  Contact in 4 to 6 weeks with update and we can increase dose if needed. 4. Limit use of pain relievers to no more than 2 days out of the week.  These medications include acetaminophen, NSAIDs (ibuprofen/Advil/Motrin, naproxen/Aleve, triptans (Imitrex/sumatriptan), Excedrin, and narcotics.  This will help reduce risk of rebound headaches. 5. Be aware of common food triggers:  - Caffeine:  coffee, black tea, cola, Mt. Dew  - Chocolate  - Dairy:  aged cheeses (brie, blue, cheddar, gouda, [place], provolone, [place], Swiss, etc), chocolate milk, buttermilk, sour cream, limit eggs and yogurt  - Nuts, peanut butter  - Alcohol  - Cereals/grains:  FRESH breads (fresh bagels, sourdough, doughnuts), yeast productions  - Processed/canned/aged/cured meats (pre-packaged deli meats, hotdogs)  - MSG/glutamate:  soy sauce, flavor enhancer, pickled/preserved/marinated foods  - Sweeteners:  aspartame (Equal, Nutrasweet).  Sugar and Splenda are okay  - Vegetables:  legumes (lima beans, lentils, snow peas, fava beans, pinto peans, peas, garbanzo beans), sauerkraut, onions, olives, pickles  - Fruit:  avocados, bananas, citrus fruit (orange, lemon, grapefruit), mango  - Other:  Frozen meals, macaroni and cheese 6. Routine exercise 7. Stay adequately hydrated (aim for 64 oz water daily) 8. Keep headache diary 9. Maintain proper stress management 10. Maintain proper sleep hygiene 11. Do not skip meals 12. Consider supplements:  magnesium citrate 400mg  daily, riboflavin 400mg  daily, coenzyme Q10 100mg  three times daily. 13. Follow up in 4 to 6 months.

## 2020-03-28 ENCOUNTER — Ambulatory Visit (INDEPENDENT_AMBULATORY_CARE_PROVIDER_SITE_OTHER): Payer: 59 | Admitting: Neurology

## 2020-03-28 ENCOUNTER — Other Ambulatory Visit: Payer: Self-pay

## 2020-03-28 DIAGNOSIS — R55 Syncope and collapse: Secondary | ICD-10-CM

## 2020-03-28 DIAGNOSIS — G43009 Migraine without aura, not intractable, without status migrainosus: Secondary | ICD-10-CM

## 2020-03-28 DIAGNOSIS — R4020 Unspecified coma: Secondary | ICD-10-CM

## 2020-03-28 NOTE — Procedures (Signed)
ELECTROENCEPHALOGRAM REPORT  Date of Study: 03/28/2020  Patient's Name: Andrew Lynch MRN: [id number] Date of Birth: July 25, 1996    Clinical History: 24 year old male with history of migraine, ADHD and recurrent syncope presents with recurrent near syncope.  Medications: Buspar  Technical Summary: A multichannel digital EEG recording measured by the international 10-20 system with electrodes applied with paste and impedances below 5000 ohms performed in our laboratory with EKG monitoring in an awake and drowsy patient.  Hyperventilation was not performed as patient was wearing a mask due to the Covid pandemic.  Photic stimulation was performed.  The digital EEG was referentially recorded, reformatted, and digitally filtered in a variety of bipolar and referential montages for optimal display.    Description: The patient is awake and drowsy during the recording.  During maximal wakefulness, there is a symmetric, medium voltage 10 Hz posterior dominant rhythm that attenuates with eye opening.  The record is symmetric.  Stage 2 sleep was not seen.  Photic stimulation did not elicit any abnormalities.  There were no epileptiform discharges or electrographic seizures seen.    EKG lead was unremarkable.  Impression: This awake and drowsy EEG is normal.    Clinical Correlation: A normal EEG does not exclude a clinical diagnosis of epilepsy.  If further clinical questions remain, prolonged EEG may be helpful.  Clinical correlation is advised.   Shon Millet, DO

## 2020-04-06 ENCOUNTER — Ambulatory Visit
Admission: RE | Admit: 2020-04-06 | Discharge: 2020-04-06 | Disposition: A | Discharge status: Home / Self Care | Payer: 59 | Source: Ambulatory Visit | Attending: Neurology | Admitting: Neurology

## 2020-04-06 ENCOUNTER — Other Ambulatory Visit: Payer: Self-pay

## 2020-04-06 DIAGNOSIS — R4020 Unspecified coma: Secondary | ICD-10-CM

## 2020-04-06 DIAGNOSIS — R55 Syncope and collapse: Secondary | ICD-10-CM

## 2020-04-06 DIAGNOSIS — G43009 Migraine without aura, not intractable, without status migrainosus: Secondary | ICD-10-CM

## 2020-04-06 MED ORDER — GADOBENATE DIMEGLUMINE 529 MG/ML IV SOLN
20.0000 mL | Freq: Once | INTRAVENOUS | Status: AC | PRN
  Administered 2020-04-06: 20 mL via INTRAVENOUS

## 2020-04-08 ENCOUNTER — Telehealth: Payer: Self-pay

## 2020-04-08 NOTE — Telephone Encounter (Signed)
Called patient and informed him of results. patient verbalized understanding.

## 2020-04-08 NOTE — Telephone Encounter (Signed)
-----   Message from Drema Dallas, DO sent at 04/08/2020  7:29 AM EDT ----- MRI of brain is normal.

## 2020-04-10 ENCOUNTER — Other Ambulatory Visit: Payer: Self-pay

## 2020-04-10 DIAGNOSIS — R55 Syncope and collapse: Secondary | ICD-10-CM

## 2020-04-22 ENCOUNTER — Other Ambulatory Visit: Payer: Self-pay

## 2020-04-22 ENCOUNTER — Ambulatory Visit (INDEPENDENT_AMBULATORY_CARE_PROVIDER_SITE_OTHER): Payer: 59 | Admitting: Neurology

## 2020-04-22 DIAGNOSIS — R55 Syncope and collapse: Secondary | ICD-10-CM

## 2020-04-22 NOTE — Progress Notes (Signed)
ERROR

## 2020-04-24 NOTE — Procedures (Signed)
ELECTROENCEPHALOGRAM REPORT  Dates of Recording: 04/22/2020 at 12:44 to 04/23/2020 at 13:18  Patient's Name: Andrew Lynch MRN: [id number] Date of Birth: 03-13-1996   Procedure: 24-hour ambulatory EEG  History: 24 year old male with episodes of dizziness and fainting followed by headache  Medications:  BUSPAR 10 MG tablet ZYRTEC 10 MG tablet BENADRYL 25 MG tablet PEPCID 20 MG tablet PROZAC 40 MG capsule FLONASE 50 MCG/ACT nasal spray LEVSIN SL 0.125 MG SL tablet PRILOSEC 20 MG capsule DELTASONE 10 MG tablet  Technical Summary: This is a 24-hour multichannel digital EEG recording measured by the international 10-20 system with electrodes applied with paste and impedances below 5000 ohms performed as portable with EKG monitoring.  The digital EEG was referentially recorded, reformatted, and digitally filtered in a variety of bipolar and referential montages for optimal display.    DESCRIPTION OF RECORDING: During maximal wakefulness, the background activity consisted of a symmetric 9.5 Hz posterior dominant rhythm which was reactive to eye opening.  There were no epileptiform discharges or focal slowing seen in wakefulness.  During the recording, the patient progresses through wakefulness, drowsiness, and Stage 2 sleep.  Again, there were no epileptiform discharges seen.  Events:  There were no electrographic seizures seen.  EKG lead was unremarkable.  IMPRESSION: This 24-hour ambulatory EEG study is normal.    CLINICAL CORRELATION: A normal EEG does not exclude a clinical diagnosis of epilepsy.  If further clinical questions remain, inpatient video EEG monitoring may be helpful.   Shon Millet, DO

## 2020-06-20 ENCOUNTER — Telehealth: Payer: Self-pay | Admitting: Neurology

## 2020-06-20 NOTE — Telephone Encounter (Signed)
Patient's mother called in wanting to find out the results of the patient's EEG on 04/22/20. She stated the patient doesn't remember.

## 2020-06-20 NOTE — Telephone Encounter (Signed)
Mother [name] advised of results.

## 2020-07-01 ENCOUNTER — Telehealth: Payer: Self-pay | Admitting: Neurology

## 2020-07-01 NOTE — Telephone Encounter (Signed)
Advised Pt mother pt is to have Cardiac work up per DR.Jaffe notes of Last EEG.

## 2020-07-01 NOTE — Telephone Encounter (Signed)
Patient mother states that we were sending patient to Dr Donette Larry office and the have not heard anything from [place] and that was in Sept. Please call patient mother

## 2020-09-12 NOTE — Progress Notes (Deleted)
NEUROLOGY FOLLOW UP OFFICE NOTE  Tanis Burnley [id number]   Subjective:  Yon Schiffman is a 25 year old male with ADHD and goiter who follows up for ***  UPDATE: For migraines, he was started on nortriptyline in August. ***  MRI of brain with and without contrast on 04/08/2020 personally reviewed was normal.  Routine EEG on 03/28/2020 was normal.  24 hour ambulatory EEG on 9/13-9/14/2021 was normal but also did not capture any habitual spells.  Current NSAIDs/analgesics:  *** Current antidepressant:  Nortriptyline ***  HISTORY: When he was [age], he has had episodes of dizziness, sometimes associated with fainting followed by headache. Usually it would occur after standing up but sometimes it would occur while standing or walking. It lasts a few seconds.  No associated palpitations, chest pain or shortness of breath.  It resolved and returned earlier this year.  It will be triggered with quick change in position or turning around quickly.  Dizziness is described as a lightheadedness.  Sometimes tunnel vision or blurred vision.  Afterwards, he feels a severe pounding pain in the back of his head with photophobia and phonophobia but rarely nausea.  No convulsions, tongue biting, incontinence or postictal confusion.  No tinnitus or hearing loss but sometimes has sharp pain in his ears.  Headaches last about an hour.  He had about 2 over the past month.  Since episodes returned, he has not passed out (only near syncope).  He drinks a lot of soda [facility], Dr. Reino Kent) but not water and doesn't eat much (maybe 2 meals a day).  He treats headaches with ibuprofen.  CBC was normal.  Chem profile unremarkable except for mildly low potassium of 3.3.  He does reports occasional classic migraines. No history of cardiac issues at a young age.     PAST MEDICAL HISTORY: Past Medical History:  Diagnosis Date  . ADHD (attention deficit hyperactivity disorder)   . Goiter   . Movement disorder   .  Physical growth delay   . Poor appetite   . Puberty delay   . Stomach pain     MEDICATIONS: Current Outpatient Medications on File Prior to Visit  Medication Sig Dispense Refill  . busPIRone (BUSPAR) 10 MG tablet Take 10 mg by mouth 2 (two) times daily. (Patient not taking: Reported on 03/12/2020)    . cetirizine (ZYRTEC) 10 MG tablet Take 10 mg by mouth daily.    . diphenhydrAMINE (BENADRYL) 25 MG tablet Take 1 tablet (25 mg total) by mouth every 6 (six) hours as needed for itching. 30 tablet 0  . famotidine (PEPCID) 20 MG tablet Take 1 tablet (20 mg total) by mouth 2 (two) times daily as needed for indigestion (itching). 30 tablet 0  . FLUoxetine (PROZAC) 40 MG capsule Take 40 mg by mouth daily. (Patient not taking: Reported on 03/12/2020)    . fluticasone (FLONASE) 50 MCG/ACT nasal spray Place 1 spray into both nostrils daily.    . hyoscyamine (LEVSIN SL) 0.125 MG SL tablet Take 1 tablet by mouth every 6 (six) hours as needed for cramping. (Patient not taking: Reported on 03/12/2020)    . nortriptyline (PAMELOR) 10 MG capsule Take 1 capsule (10 mg total) by mouth at bedtime. 30 capsule 5  . omeprazole (PRILOSEC) 20 MG capsule Take 20 mg by mouth daily.    . predniSONE (DELTASONE) 10 MG tablet Take 40 mg of prednisone in the morning with food for 3 days, then 30 mg of prednisone for  2 days, then 20 mg of prednisone for 1 day. (Patient not taking: Reported on 03/12/2020) 20 tablet 0   No current facility-administered medications on file prior to visit.    ALLERGIES: Allergies  Allergen Reactions  . Poison Ivy Extract Rash  . Poison Oak Extract Rash    FAMILY HISTORY: Family History  Problem Relation Age of Onset  . Diabetes Maternal Grandfather   . Diabetes Paternal Uncle   . Thyroid disease Neg Hx     SOCIAL HISTORY: Social History   Socioeconomic History  . Marital status: Single    Spouse name: Not on file  . Number of children: Not on file  . Years of education: Not on file   . Highest education level: Not on file  Occupational History  . Not on file  Tobacco Use  . Smoking status: Never Smoker  . Smokeless tobacco: Never Used  Vaping Use  . Vaping Use: Former  Substance and Sexual Activity  . Alcohol use: No  . Drug use: No  . Sexual activity: Not Currently  Other Topics Concern  . Not on file  Social History Narrative   Right Handed   One Story Home   Drinks Caffeine    Social Determinants of Health   Financial Resource Strain: Not on file  Food Insecurity: Not on file  Transportation Needs: Not on file  Physical Activity: Not on file  Stress: Not on file  Social Connections: Not on file  Intimate Partner Violence: Not on file     Objective:  *** General: No acute distress.  Patient appears ***-groomed.   Head:  Normocephalic/atraumatic Eyes:  Fundi examined but not visualized Neck: supple, no paraspinal tenderness, full range of motion Heart:  Regular rate and rhythm Lungs:  Clear to auscultation bilaterally Back: No paraspinal tenderness Neurological Exam: alert and oriented to person, place, and time. Attention span and concentration intact, recent and remote memory intact, fund of knowledge intact.  Speech fluent and not dysarthric, language intact.  CN II-XII intact. Bulk and tone normal, muscle strength 5/5 throughout.  Sensation to light touch, temperature and vibration intact.  Deep tendon reflexes 2+ throughout, toes downgoing.  Finger to nose and heel to shin testing intact.  Gait normal, Romberg negative.   Assessment/Plan:   1.  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   2.  Migraine without aura, without status migrainosus, not intractable  1.  ***  Shon Millet, DO  CC: Georgann Housekeeper, MD

## 2020-09-13 ENCOUNTER — Ambulatory Visit: Payer: 59 | Admitting: Neurology

## 2022-02-02 ENCOUNTER — Emergency Department (HOSPITAL_BASED_OUTPATIENT_CLINIC_OR_DEPARTMENT_OTHER)
Admission: EM | Admit: 2022-02-02 | Discharge: 2022-02-02 | Discharge status: Against Medical Advice | Payer: Self-pay | Attending: Emergency Medicine | Admitting: Emergency Medicine

## 2022-02-02 ENCOUNTER — Other Ambulatory Visit: Payer: Self-pay

## 2022-02-02 ENCOUNTER — Encounter (HOSPITAL_BASED_OUTPATIENT_CLINIC_OR_DEPARTMENT_OTHER): Payer: Self-pay | Admitting: Emergency Medicine

## 2022-02-02 DIAGNOSIS — W57XXXA Bitten or stung by nonvenomous insect and other nonvenomous arthropods, initial encounter: Secondary | ICD-10-CM | POA: Insufficient documentation

## 2022-02-02 DIAGNOSIS — Z5321 Procedure and treatment not carried out due to patient leaving prior to being seen by health care provider: Secondary | ICD-10-CM | POA: Insufficient documentation

## 2022-02-02 DIAGNOSIS — S80861A Insect bite (nonvenomous), right lower leg, initial encounter: Secondary | ICD-10-CM | POA: Insufficient documentation

## 2022-02-03 ENCOUNTER — Encounter: Payer: Self-pay | Admitting: *Deleted

## 2022-02-03 ENCOUNTER — Ambulatory Visit
Admission: EM | Admit: 2022-02-03 | Discharge: 2022-02-03 | Disposition: A | Discharge status: Home / Self Care | Payer: Self-pay

## 2022-02-03 DIAGNOSIS — S80261A Insect bite (nonvenomous), right knee, initial encounter: Secondary | ICD-10-CM

## 2022-02-03 DIAGNOSIS — W57XXXA Bitten or stung by nonvenomous insect and other nonvenomous arthropods, initial encounter: Secondary | ICD-10-CM

## 2022-02-03 MED ORDER — TRIAMCINOLONE ACETONIDE 0.1 % EX OINT: 1.0000 | TOPICAL_OINTMENT | Freq: Two times a day (BID) | CUTANEOUS | 0 refills | Status: DC

## 2022-04-23 ENCOUNTER — Ambulatory Visit: Payer: Self-pay

## 2022-04-23 ENCOUNTER — Ambulatory Visit
Admission: EM | Admit: 2022-04-23 | Discharge: 2022-04-23 | Disposition: A | Discharge status: Home / Self Care | Payer: Self-pay | Attending: Nurse Practitioner | Admitting: Nurse Practitioner

## 2022-04-23 DIAGNOSIS — R21 Rash and other nonspecific skin eruption: Secondary | ICD-10-CM | POA: Insufficient documentation

## 2022-04-23 MED ORDER — TRIAMCINOLONE ACETONIDE 0.1 % EX OINT: 1.0000 | TOPICAL_OINTMENT | Freq: Two times a day (BID) | CUTANEOUS | 0 refills | Status: DC

## 2022-04-23 NOTE — Discharge Instructions (Addendum)
We will call you with any positive test results.  Please start using the topical steroid ointment to the itchy areas.  You can also start on an oral over the counter antihistamine like cetirizine 10 mg daily.  The rash should improve over the next 2-4 weeks or so.  Please seek care if it does not get better.

## 2022-04-23 NOTE — ED Triage Notes (Signed)
Pt presents with c/o blisters all around his body. Reports they are on his legs, genital area and stomach. Pt states it started Monday and denies being around anything that could possibly cause a reaction. Pt reports no draining.

## 2022-04-23 NOTE — ED Provider Notes (Signed)
[facility]    CSN: [id number] Arrival date & time: 04/23/22  1007      History   Chief Complaint Chief Complaint  Patient presents with   Appointment    1000    HPI Andrew Lynch is a 26 y.o. male.   Patient presents with rash/blisters that he first noticed starting in his feet on Monday.  Reports the blisters and itchy areas have moved up his legs, to his groin, abdomen, and chest.  Reports the rash is red, itchy, and burns in the areas.  No recent change in detergents, soaps, personal care products.  No shortness of breath, throat or tongue swelling, new muscle pain or joint aches.  No known exposures.  Reports he is allergic to poison ivy/poison oak, does not think he has been exposed.  Reports he was last sexually active on Monday, currently is in a monogamous relationship.  Reports his partner has a similar rash that began after his rash started.    Past Medical History:  Diagnosis Date   ADHD (attention deficit hyperactivity disorder)    Goiter    Movement disorder    Physical growth delay    Poor appetite    Puberty delay    Stomach pain     Patient Active Problem List   Diagnosis Date Noted   MDD (major depressive disorder), recurrent episode, moderate (HCC) 06/10/2016   Thyroiditis, autoimmune 07/10/2014   Benign paroxysmal positional vertigo 02/21/2013   Dyspepsia 02/21/2013   Orthostatic hypotension 11/21/2012   Physical growth delay    ADHD (attention deficit hyperactivity disorder)    Goiter    Puberty delay    Poor appetite    Stomach pain    Lack of expected normal physiological development in childhood 01/21/2011   Goiter, unspecified 01/21/2011   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 Authorizing  Provider  triamcinolone ointment (KENALOG) 0.1 % Apply 1 Application topically 2 (two) times daily. Apply sparingly to clean, itchy areas twice daily 04/23/22  Yes Cathlean Marseilles A, NP  busPIRone (BUSPAR) 10 MG tablet Take 10 mg by mouth 2 (two) times daily. Patient not taking: Reported on 03/12/2020 01/20/16   [provider]  cetirizine (ZYRTEC) 10 MG tablet Take 10 mg by mouth daily.    [provider]  diphenhydrAMINE (BENADRYL) 25 MG tablet Take 1 tablet (25 mg total) by mouth every 6 (six) hours as needed for itching. 01/26/18   Fawze, Mina A, PA-C  famotidine (PEPCID) 20 MG tablet Take 1 tablet (20 mg total) by mouth 2 (two) times daily as needed for indigestion (itching). 01/26/18   Fawze, Mina A, PA-C  FLUoxetine (PROZAC) 40 MG capsule Take 40 mg by mouth daily. Patient not taking: Reported on 03/12/2020    [provider]  fluticasone (FLONASE) 50 MCG/ACT nasal spray Place 1 spray into both nostrils daily.    [provider]  hyoscyamine (LEVSIN SL) 0.125 MG SL tablet Take 1 tablet by mouth every 6 (six) hours as needed for cramping. Patient not taking: Reported on 03/12/2020 05/26/16   [provider]  nortriptyline (PAMELOR) 10 MG capsule Take 1 capsule (10 mg total) by mouth at bedtime. 03/12/20   Drema Dallas, DO  omeprazole (PRILOSEC) 20 MG capsule Take 20 mg by mouth daily. 06/09/16 06/09/17  [provider]  predniSONE (DELTASONE) 10 MG tablet Take 40 mg of prednisone in the morning with food for 3 days, then 30 mg of prednisone for 2 days, then 20 mg of prednisone for 1 day. Patient not taking: Reported on 03/12/2020 01/26/18   Michela Pitcher A, PA-C  UNKNOWN TO PATIENT "An allergy med and a med for my stomach" but unk which ones    [provider]    Family History Family History  Problem Relation Age of Onset   Diabetes Maternal Grandfather    Diabetes Paternal Uncle    Thyroid disease Neg Hx     Social History Social  History   Tobacco Use   Smoking status: Former    Types: Cigarettes   Smokeless tobacco: Never  Vaping Use   Vaping Use: Former  Substance Use Topics   Alcohol use: No   Drug use: No     Allergies   Poison ivy extract and Poison oak extract   Review of Systems Review of Systems Per HPI  Physical Exam Triage Vital Signs ED Triage Vitals  Enc Vitals Group     BP 04/23/22 1018 (!) 148/77     Pulse Rate 04/23/22 1018 80     Resp 04/23/22 1018 18     Temp 04/23/22 1018 98.1 F (36.7 C)     Temp Source 04/23/22 1018 Oral     SpO2 04/23/22 1018 98 %     Weight --      Height --      Head Circumference --      Peak Flow --      Pain Score 04/23/22 1017 0     Pain Loc --      Pain Edu? --      Excl. in GC? --    No data found.  Updated Vital Signs BP (!) 148/77 (BP Location: Right Arm)   Pulse 80   Temp 98.1 F (36.7 C) (Oral)   Resp 18   SpO2 98%   Visual Acuity Right Eye Distance:   Left Eye Distance:   Bilateral Distance:    Right Eye Near:   Left Eye Near:    Bilateral Near:     Physical Exam Vitals and nursing note reviewed. Exam conducted with a chaperone present [facility] [place], [place]).  Constitutional:      General: He is not in acute distress.    Appearance: He is not toxic-appearing.  HENT:     Mouth/Throat:     Mouth: Mucous membranes are moist.     Pharynx: Oropharynx is clear. No oropharyngeal exudate or posterior oropharyngeal erythema.  Eyes:     General: No scleral icterus. Pulmonary:     Effort: Pulmonary effort is normal. No respiratory distress.  Genitourinary:    Pubic Area: No rash.      Penis: Circumcised. Lesions present. No discharge.      Testes: Normal.       Comments: Papule to left lateral shaft of penis and approximately area marked. Musculoskeletal:     Cervical back: Normal range of motion.  Lymphadenopathy:     Cervical: No cervical adenopathy.  Skin:    General: Skin is warm and dry.     Capillary Refill:  Capillary refill takes less than 2 seconds.     Findings: Erythema and rash present. Rash is papular. Rash is not macular or pustular.  Comments: Erythematous, papular rash to bilateral lower extremities.  There also scabbed lesions to the lower abdominal fold, trunk, arms bilaterally.  No surrounding erythema, fluctuance, warmth.  No active drainage.  Neurological:     Mental Status: He is alert and oriented to person, place, and time.  Psychiatric:        Behavior: Behavior is cooperative.      UC Treatments / Results  Labs (all labs ordered are listed, but only abnormal results are displayed) Labs Reviewed  HSV CULTURE AND TYPING  HIV ANTIBODY (ROUTINE TESTING W REFLEX)  RPR  CYTOLOGY, (ORAL, ANAL, URETHRAL) ANCILLARY ONLY    EKG   Radiology No results found.  Procedures Procedures (including critical care time)  Medications Ordered in UC Medications - No data to display  Initial Impression / Assessment and Plan / UC Course  I have reviewed the triage vital signs and the nursing notes.  Pertinent labs & imaging results that were available during my care of the patient were reviewed by me and considered in my medical decision making (see chart for details).    Patient is well-appearing, normotensive, not tachycardic, not tachypneic, afebrile, oxygenating well on room air.  Discussed with patient that rash is consistent with possible systemic HSV, or other viral syndrome.  Given recent sexual activity, STI testing obtained.  Discussed use of oral antihistamine daily to help with itching.  Can also start triamcinolone ointment twice daily as needed for itching.  ER precautions and return precautions discussed.  The patient was given the opportunity to ask questions.  All questions answered to their satisfaction.  The patient is in agreement to this plan.  Final Clinical Impressions(s) / UC Diagnoses   Final diagnoses:  Rash and nonspecific skin eruption     Discharge  Instructions      We will call you with any positive test results.  Please start using the topical steroid ointment to the itchy areas.  You can also start on an oral over the counter antihistamine like cetirizine 10 mg daily.  The rash should improve over the next 2-4 weeks or so.  Please seek care if it does not get better.     ED Prescriptions     Medication Sig Dispense Auth. Provider   triamcinolone ointment (KENALOG) 0.1 % Apply 1 Application topically 2 (two) times daily. Apply sparingly to clean, itchy areas twice daily 30 g Valentino Nose, NP      PDMP not reviewed this encounter.   Valentino Nose, NP 04/23/22 1529

## 2022-04-24 LAB — HIV ANTIBODY (ROUTINE TESTING W REFLEX): HIV Screen 4th Generation wRfx: NONREACTIVE

## 2022-04-24 LAB — CYTOLOGY, (ORAL, ANAL, URETHRAL) ANCILLARY ONLY
Chlamydia: NEGATIVE
Comment: NEGATIVE
Comment: NEGATIVE
Comment: NORMAL
Neisseria Gonorrhea: NEGATIVE
Trichomonas: NEGATIVE

## 2022-04-24 LAB — RPR: RPR Ser Ql: NONREACTIVE

## 2022-04-26 LAB — HSV CULTURE AND TYPING

## 2022-08-04 IMAGING — MR MR HEAD WO/W CM
12 series · 48 of 48 positions shown · IV contrast (multihance)
Comparison: None available.

CLINICAL DATA: Initial evaluation for headaches, dizziness.

EXAM:
MRI HEAD WITHOUT AND WITH CONTRAST
TECHNIQUE: Multiplanar, multiecho pulse sequences of the brain and surrounding
structures were obtained without and with intravenous contrast.
CONTRAST:  20mL MULTIHANCE GADOBENATE DIMEGLUMINE 529 MG/ML IV SOLN

[Series 5: T1 · sagittal · 4.0mm · 0.94mm/px · 1 of 31 slices shown (1 of 3)]
[im 1/31]
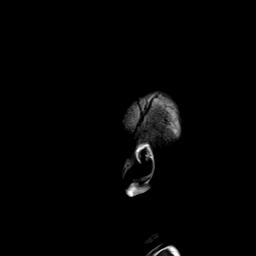

[Series 6: DWI · axial · 3.0mm · 1.44mm/px · z∈[-45,+105]mm · 5 of 95 slices shown (1 of 4)]
[im 1/95]
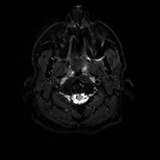
[im 24/95]
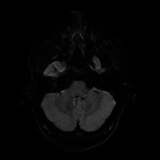
[im 48/95]
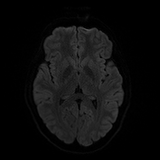
[im 71/95]
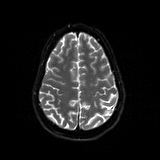
[im 95/95]
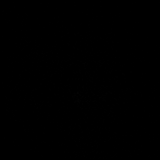

[Series 7: DWI · axial · 3.0mm · 1.44mm/px · z∈[-45,+102]mm · 2 of 46 slices shown (2 of 4)]
[im 1/46]
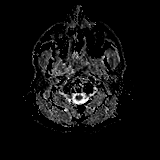
[im 46/46]
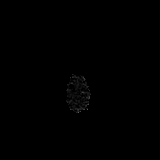

[Series 8: DWI · coronal · 5.0mm · 1.44mm/px · 4 of 60 slices shown (3 of 4)]
[im 1/60]
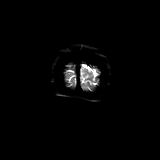
[im 20/60]
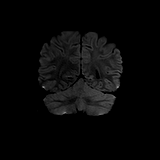
[im 40/60]
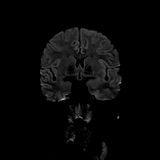
[im 60/60]
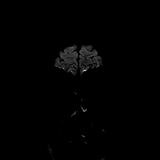

[Series 9: DWI · coronal · 5.0mm · 1.44mm/px · 2 of 30 slices shown (4 of 4)]
[im 1/30]
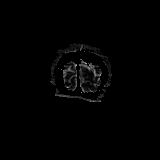
[im 30/30]
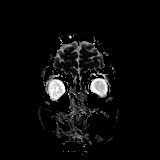

[Series 10: T2 · axial · 4.0mm · 0.36mm/px · z∈[-40,+101]mm · 2 of 29 slices shown]
[im 1/29]
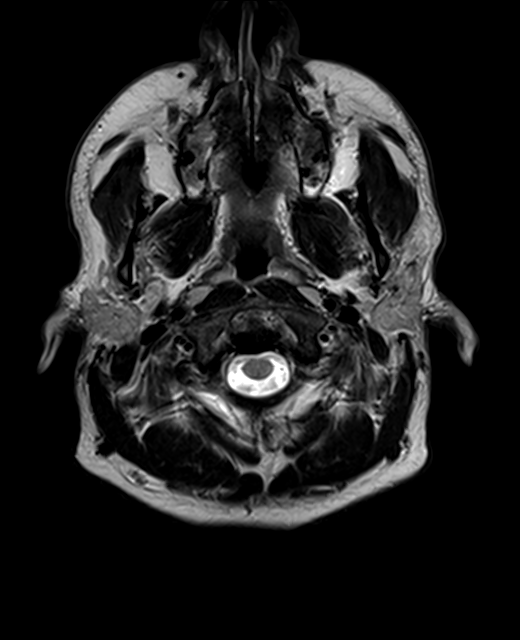
[im 29/29]
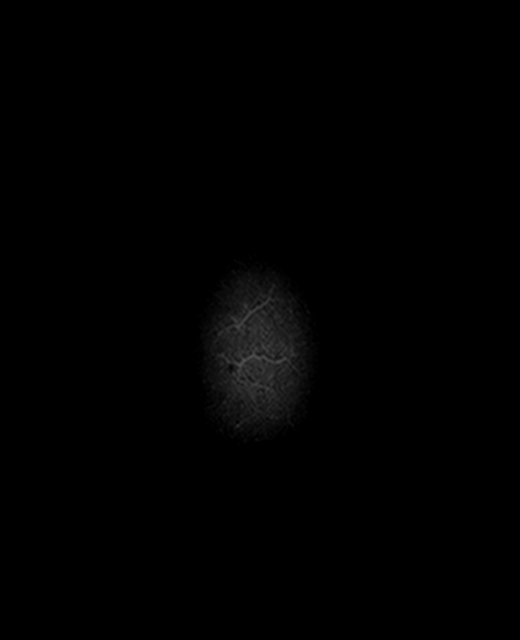

[Series 11: FLAIR · axial · 3.0mm · 0.72mm/px · z∈[-45,+100]mm · 2 of 26 slices shown]
[im 1/26]
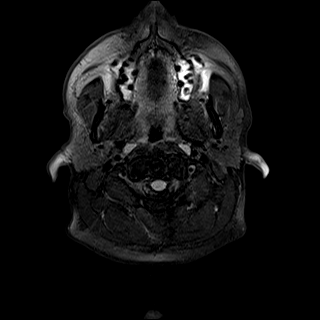
[im 26/26]
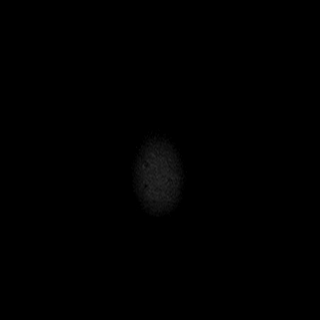

[Series 13: swi_images · axial · 1.5mm · 0.90mm/px · z∈[-38,+100]mm · 6 of 96 slices shown]
[im 1/96]
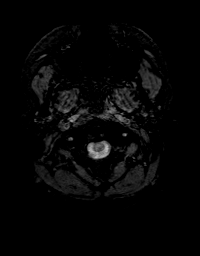
[im 20/96]
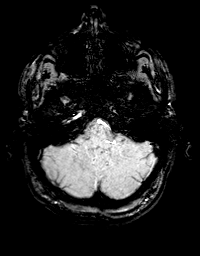
[im 39/96]
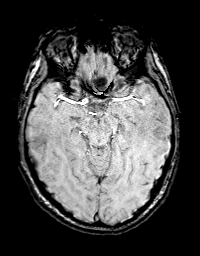
[im 58/96]
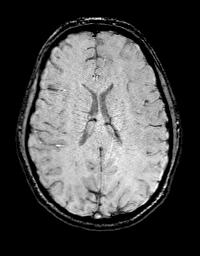
[im 77/96]
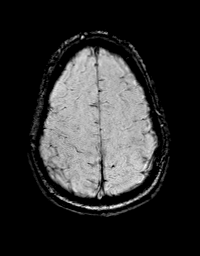
[im 96/96]
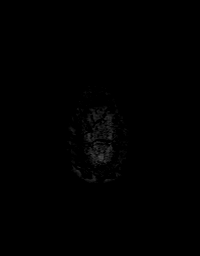

[Series 14: T1 · axial · 1.0mm · 0.94mm/px · z∈[-53,+103]mm · 10 of 160 slices shown (2 of 3)]
[im 1/160]
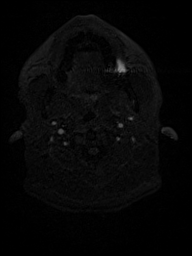
[im 18/160]
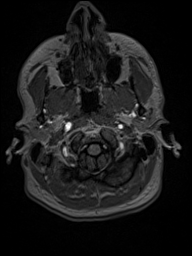
[im 36/160]
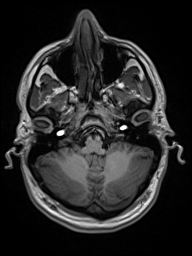
[im 54/160]
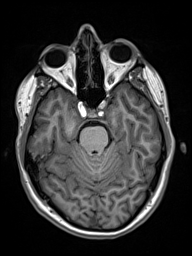
[im 71/160]
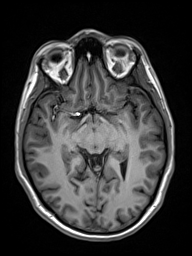
[im 89/160]
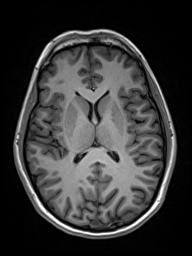
[im 107/160]
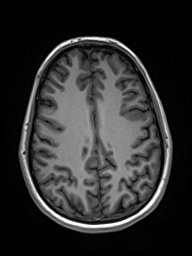
[im 124/160]
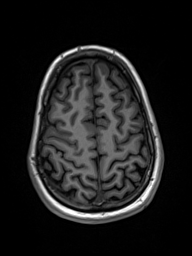
[im 142/160]
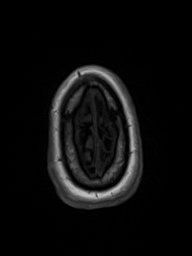
[im 160/160]
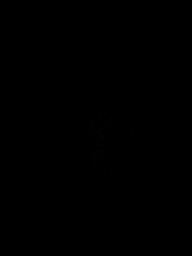

[Series 15: T2 post-contrast · coronal · 4.0mm · 0.36mm/px · 2 of 35 slices shown]
[im 1/35]
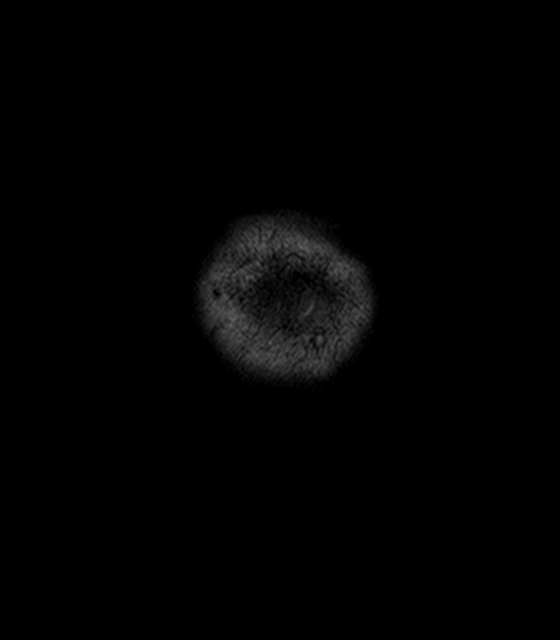
[im 35/35]
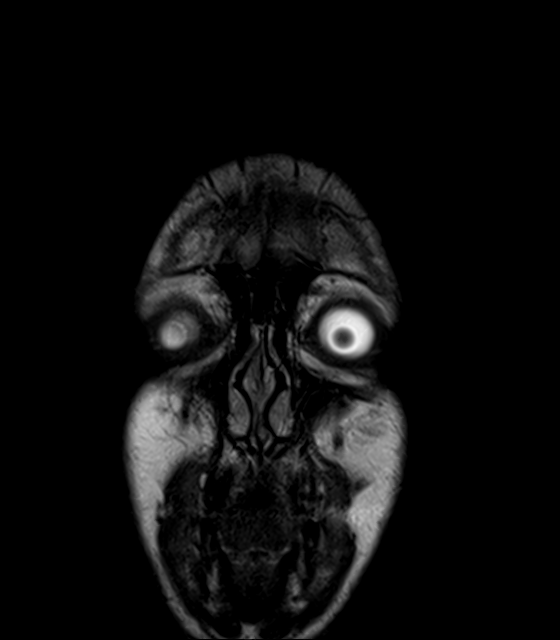

[Series 16: T1 · axial · 1.0mm · 0.94mm/px · z∈[-53,+103]mm · 10 of 160 slices shown (3 of 3)]
[im 1/160]
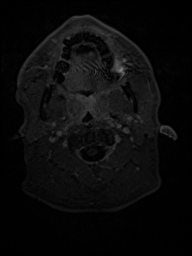
[im 18/160]
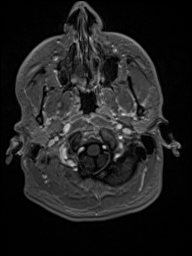
[im 36/160]
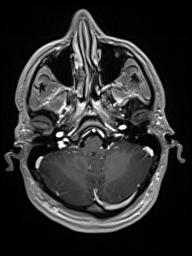
[im 54/160]
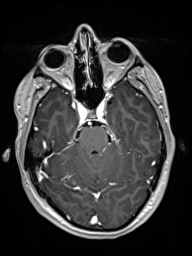
[im 71/160]
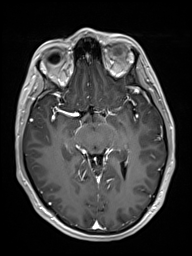
[im 89/160]
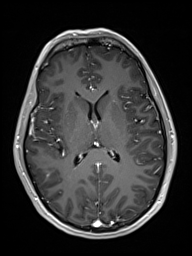
[im 107/160]
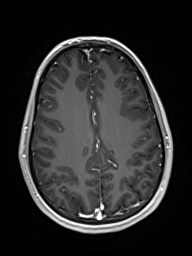
[im 124/160]
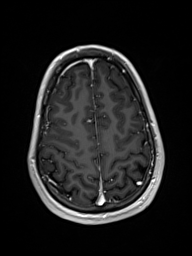
[im 142/160]
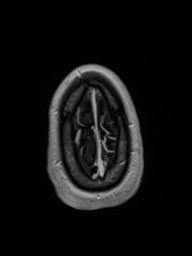
[im 160/160]
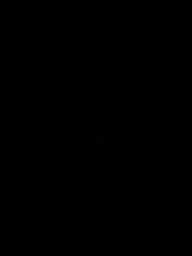

[Series 17: T1 post-contrast · coronal · 4.0mm · 0.72mm/px · 2 of 35 slices shown]
[im 1/35]
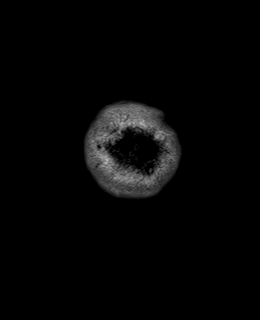
[im 35/35]
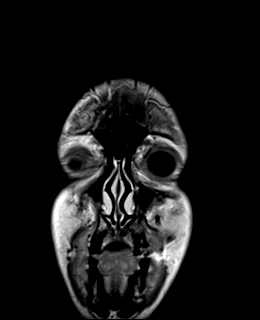

[48 of 48 positions shown; findings below may reference images not displayed]

FINDINGS: Brain: Cerebral volume within normal limits for patient age. No
focal parenchymal signal abnormality identified.

No abnormal foci of restricted diffusion to suggest acute or
subacute ischemia. Gray-white matter differentiation well
maintained. No encephalomalacia to suggest chronic infarction. No
foci of susceptibility artifact to suggest acute or chronic
intracranial hemorrhage.

No mass lesion, midline shift or mass effect. No hydrocephalus. No
extra-axial fluid collection. Major dural sinuses are grossly
patent.

Pituitary gland and suprasellar region are normal. Midline
structures intact and normal.

No abnormal enhancement.

Vascular: Major intracranial vascular flow voids well maintained and
normal in appearance.

Skull and upper cervical spine: Craniocervical junction normal.
Visualized upper cervical spine within normal limits. Bone marrow
signal intensity normal. No scalp soft tissue abnormality.

Sinuses/Orbits: Globes and orbital soft tissues within normal
limits.

Paranasal sinuses are clear. No mastoid effusion. Inner ear
structures normal.

Other: None.
IMPRESSION: Normal brain MRI. No acute intracranial abnormality or findings to
explain patient's symptoms identified.

## 2022-11-03 ENCOUNTER — Encounter: Payer: Self-pay | Admitting: Family Medicine

## 2022-11-03 ENCOUNTER — Ambulatory Visit (INDEPENDENT_AMBULATORY_CARE_PROVIDER_SITE_OTHER): Payer: 59 | Admitting: Family Medicine

## 2022-11-03 VITALS — BP 138/89 | HR 74 | Ht 64.0 in | Wt 184.0 lb

## 2022-11-03 DIAGNOSIS — Z0001 Encounter for general adult medical examination with abnormal findings: Secondary | ICD-10-CM | POA: Diagnosis not present

## 2022-11-03 DIAGNOSIS — R7301 Impaired fasting glucose: Secondary | ICD-10-CM

## 2022-11-03 DIAGNOSIS — Z13220 Encounter for screening for lipoid disorders: Secondary | ICD-10-CM

## 2022-11-03 DIAGNOSIS — Z1159 Encounter for screening for other viral diseases: Secondary | ICD-10-CM

## 2022-11-03 DIAGNOSIS — Z23 Encounter for immunization: Secondary | ICD-10-CM | POA: Diagnosis not present

## 2022-11-03 DIAGNOSIS — Z1329 Encounter for screening for other suspected endocrine disorder: Secondary | ICD-10-CM

## 2022-11-03 NOTE — Assessment & Plan Note (Signed)
Physical exam done, labs ordered  Updated screening and health maintenance  Exercise and nutrition counseling BMI  31.58 Advise lifestyle modifications follow diet low in saturated fat, reduce dietary salt intake, avoid fatty foods, maintain an exercise routine 3 to 5 days a week for a minimum total of 150 minutes.

## 2022-11-03 NOTE — Patient Instructions (Signed)
It was pleasure meeting with you today. Follow up with your primary health provider if any health concerns arises.  

## 2022-11-03 NOTE — Progress Notes (Signed)
Complete physical exam  Patient: Andrew Lynch   DOB: 28-Jul-1996   27 y.o. Male  MRN: [id number]  Subjective:    Chief Complaint  Patient presents with   Establish Care   Hypothyroidism    Patient states his brother and father have thyroid cancer and he is concerned for his labs.     Kenyada Hollace Tawil is a 27 y.o. male who presents today for a complete physical exam. He reports consuming a general diet. The patient has a physically strenuous job, but has no regular exercise apart from work.  He generally feels tired today. He reports sleeping poorly but states he feel fine. He does not have additional problems to discuss today.    Most recent fall risk assessment:    11/03/2022   11:08 AM  Fall Risk   Falls in the past year? 0  Number falls in past yr: 0  Injury with Fall? 0  Risk for fall due to : No Fall Risks  Follow up Falls evaluation completed     Most recent depression screenings:    11/03/2022   11:08 AM  PHQ 2/9 Scores  PHQ - 2 Score 1  PHQ- 9 Score 3    Vision:Not within last year  and Dental: No current dental problems and No regular dental care   Patient Care Team: Del Ria Comment, Lamar Benes, FNP as PCP - General (Family Medicine) Pieter Partridge, DO as Consulting Physician (Neurology)   Outpatient Medications Prior to Visit  Medication Sig   cetirizine (ZYRTEC) 10 MG tablet Take 10 mg by mouth daily.   busPIRone (BUSPAR) 10 MG tablet Take 10 mg by mouth 2 (two) times daily.   diphenhydrAMINE (BENADRYL) 25 MG tablet Take 1 tablet (25 mg total) by mouth every 6 (six) hours as needed for itching.   famotidine (PEPCID) 20 MG tablet Take 1 tablet (20 mg total) by mouth 2 (two) times daily as needed for indigestion (itching).   FLUoxetine (PROZAC) 40 MG capsule Take 40 mg by mouth daily.   fluticasone (FLONASE) 50 MCG/ACT nasal spray Place 1 spray into both nostrils daily.   hyoscyamine (LEVSIN SL) 0.125 MG SL tablet Take 1 tablet by mouth every 6  (six) hours as needed for cramping.   nortriptyline (PAMELOR) 10 MG capsule Take 1 capsule (10 mg total) by mouth at bedtime.   omeprazole (PRILOSEC) 20 MG capsule Take 20 mg by mouth daily.   predniSONE (DELTASONE) 10 MG tablet Take 40 mg of prednisone in the morning with food for 3 days, then 30 mg of prednisone for 2 days, then 20 mg of prednisone for 1 day.   triamcinolone ointment (KENALOG) 0.1 % Apply 1 Application topically 2 (two) times daily. Apply sparingly to clean, itchy areas twice daily   UNKNOWN TO PATIENT "An allergy med and a med for my stomach" but unk which ones   No facility-administered medications prior to visit.    Review of Systems  Constitutional:  Negative for chills and fever.  Respiratory:  Negative for shortness of breath.   Cardiovascular:  Negative for chest pain.  Gastrointestinal:  Negative for abdominal pain.  Genitourinary:  Negative for dysuria.  Musculoskeletal:  Negative for myalgias.  Neurological:  Negative for dizziness and headaches.       Objective:    BP 138/89   Pulse 74   Ht 5\' 4"  (1.626 m)   Wt 184 lb (83.5 kg)   SpO2 96%   BMI 31.58 kg/m  BP Readings from Last 3 Encounters:  11/03/22 138/89  04/23/22 (!) 148/77  02/03/22 125/78      Physical Exam HENT:     Head: Normocephalic.     Left Ear: Tympanic membrane normal.     Nose: Nose normal.     Mouth/Throat:     Mouth: Mucous membranes are moist.  Eyes:     Extraocular Movements: Extraocular movements intact.     Pupils: Pupils are equal, round, and reactive to light.  Cardiovascular:     Rate and Rhythm: Normal rate.     Pulses: Normal pulses.  Pulmonary:     Effort: Pulmonary effort is normal.     Breath sounds: Normal breath sounds.  Abdominal:     General: Bowel sounds are normal.     Palpations: Abdomen is soft. There is no mass.     Tenderness: There is no right CVA tenderness, left CVA tenderness or guarding.  Musculoskeletal:        General: Normal range  of motion.     Cervical back: Normal range of motion and neck supple.     Right lower leg: No edema.     Left lower leg: No edema.  Skin:    General: Skin is warm and dry.     Capillary Refill: Capillary refill takes less than 2 seconds.  Neurological:     General: No focal deficit present.     Mental Status: He is alert.     Coordination: Coordination normal.     Gait: Gait normal.  Psychiatric:        Mood and Affect: Mood normal.      No results found for any visits on 11/03/22.    Assessment & Plan:    Routine Health Maintenance and Physical Exam  Immunization History  Administered Date(s) Administered   HPV 9-valent 11/03/2022   Tdap 01/21/2022    Health Maintenance  Topic Date Due   Hepatitis C Screening  Never done   HPV VACCINES (2 - Male 3-dose series) 12/01/2022   INFLUENZA VACCINE  11/08/2022 (Originally 03/10/2022)   COVID-19 Vaccine (1) 11/19/2022 (Originally 10/08/1996)   DTaP/Tdap/Td (2 - Td or Tdap) 01/22/2032   HIV Screening  Completed    Discussed health benefits of physical activity, and encouraged him to engage in regular exercise appropriate for his age and condition.  Screening for thyroid disorder -     TSH + free T4  IFG (impaired fasting glucose) -     Hemoglobin A1c  Need for hepatitis C screening test -     Hepatitis C antibody  Screening for lipid disorders -     CBC with Differential/Platelet -     CMP14+EGFR -     Lipid panel  Encounter for routine adult physical exam with abnormal findings Assessment & Plan: Physical exam done, labs ordered  Updated screening and health maintenance  Exercise and nutrition counseling BMI  31.58 Advise lifestyle modifications follow diet low in saturated fat, reduce dietary salt intake, avoid fatty foods, maintain an exercise routine 3 to 5 days a week for a minimum total of 150 minutes.    Need for HPV vaccination -     HPV 9-valent vaccine,Recombinat    No follow-ups on file.      Renard Hamper Ria Comment, FNP

## 2022-11-03 NOTE — Progress Notes (Deleted)
   New Patient Office Visit   Subjective   Patient ID: Andrew Lynch, male    DOB: 18-Oct-1995  Age: 27 y.o. MRN: [id number]  CC:  Chief Complaint  Patient presents with   Establish Care   Hypothyroidism    Patient states his brother and father have thyroid cancer and he is concerned for his labs.     HPI Andrew Lynch presents to establish care. He  has a past medical history of ADHD (attention deficit hyperactivity disorder), Goiter, Movement disorder, Physical growth delay, Poor appetite, Puberty delay, and Stomach pain.  HPI  ***  Outpatient Encounter Medications as of 11/03/2022  Medication Sig   cetirizine (ZYRTEC) 10 MG tablet Take 10 mg by mouth daily.   busPIRone (BUSPAR) 10 MG tablet Take 10 mg by mouth 2 (two) times daily.   diphenhydrAMINE (BENADRYL) 25 MG tablet Take 1 tablet (25 mg total) by mouth every 6 (six) hours as needed for itching.   famotidine (PEPCID) 20 MG tablet Take 1 tablet (20 mg total) by mouth 2 (two) times daily as needed for indigestion (itching).   FLUoxetine (PROZAC) 40 MG capsule Take 40 mg by mouth daily.   fluticasone (FLONASE) 50 MCG/ACT nasal spray Place 1 spray into both nostrils daily.   hyoscyamine (LEVSIN SL) 0.125 MG SL tablet Take 1 tablet by mouth every 6 (six) hours as needed for cramping.   nortriptyline (PAMELOR) 10 MG capsule Take 1 capsule (10 mg total) by mouth at bedtime.   omeprazole (PRILOSEC) 20 MG capsule Take 20 mg by mouth daily.   predniSONE (DELTASONE) 10 MG tablet Take 40 mg of prednisone in the morning with food for 3 days, then 30 mg of prednisone for 2 days, then 20 mg of prednisone for 1 day.   triamcinolone ointment (KENALOG) 0.1 % Apply 1 Application topically 2 (two) times daily. Apply sparingly to clean, itchy areas twice daily   UNKNOWN TO PATIENT "An allergy med and a med for my stomach" but unk which ones   No facility-administered encounter medications on file as of 11/03/2022.    Past  Surgical History:  Procedure Laterality Date   SMALL INTESTINE SURGERY      ROS    Objective    BP 138/89   Pulse 74   Ht 5\' 4"  (1.626 m)   Wt 184 lb (83.5 kg)   SpO2 96%   BMI 31.58 kg/m   Physical Exam    Assessment & Plan:  There are no diagnoses linked to this encounter.  No follow-ups on file.   Renard Hamper Ria Comment, FNP

## 2022-11-05 ENCOUNTER — Encounter (HOSPITAL_COMMUNITY): Payer: Self-pay

## 2022-11-05 ENCOUNTER — Other Ambulatory Visit: Payer: Self-pay

## 2022-11-05 ENCOUNTER — Emergency Department (HOSPITAL_COMMUNITY)
Admission: EM | Admit: 2022-11-05 | Discharge: 2022-11-06 | Disposition: A | Discharge status: Home / Self Care | Payer: 59 | Attending: Emergency Medicine | Admitting: Emergency Medicine

## 2022-11-05 DIAGNOSIS — S61303A Unspecified open wound of left middle finger with damage to nail, initial encounter: Secondary | ICD-10-CM | POA: Insufficient documentation

## 2022-11-05 DIAGNOSIS — S6992XA Unspecified injury of left wrist, hand and finger(s), initial encounter: Secondary | ICD-10-CM

## 2022-11-05 DIAGNOSIS — X58XXXA Exposure to other specified factors, initial encounter: Secondary | ICD-10-CM | POA: Insufficient documentation

## 2022-11-05 DIAGNOSIS — T148XXA Other injury of unspecified body region, initial encounter: Secondary | ICD-10-CM

## 2022-11-05 MED ORDER — OXYCODONE-ACETAMINOPHEN 5-325 MG PO TABS: 1.0000 | ORAL_TABLET | Freq: Four times a day (QID) | ORAL | 0 refills | Status: DC | PRN

## 2022-11-05 MED ORDER — LIDOCAINE HCL (PF) 1 % IJ SOLN
10.0000 mL | Freq: Once | INTRAMUSCULAR | Status: AC
  Administered 2022-11-05: 10 mL

## 2022-11-05 MED ORDER — TETANUS-DIPHTH-ACELL PERTUSSIS 5-2.5-18.5 LF-MCG/0.5 IM SUSY
0.5000 mL | PREFILLED_SYRINGE | Freq: Once | INTRAMUSCULAR | Status: DC
  Filled 2022-11-05: qty 0.5

## 2022-11-05 MED ORDER — LIDOCAINE HCL (PF) 1 % IJ SOLN
10.0000 mL | Freq: Once | INTRAMUSCULAR | Status: AC
  Administered 2022-11-05: 10 mL
  Filled 2022-11-05: qty 10

## 2022-11-05 NOTE — ED Provider Notes (Signed)
[facility] Provider Note   CSN: [id number] Arrival date & time: 11/05/22  2113     History  Chief Complaint  Patient presents with   Finger Injury    Andrew Lynch is a 27 y.o. male who presents for evaluation of left middle finger injury.  Injury occurred approximately 6 hours ago.  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  He is right-hand dominant.  He is up-to-date on his tetanus vaccination.  He states there has been significant bleeding that he has been unable to control whenever he removes the bandage on his finger.  He states that the pain is burning, worse with pressure, better when covered and not exposed to air.   HPI     Home Medications Prior to Admission medications   Medication Sig Start Date End Date Taking? Authorizing Provider  busPIRone (BUSPAR) 10 MG tablet Take 10 mg by mouth 2 (two) times daily. 01/20/16   [provider]  cetirizine (ZYRTEC) 10 MG tablet Take 10 mg by mouth daily.    [provider]  diphenhydrAMINE (BENADRYL) 25 MG tablet Take 1 tablet (25 mg total) by mouth every 6 (six) hours as needed for itching. 01/26/18   Fawze, Mina A, PA-C  famotidine (PEPCID) 20 MG tablet Take 1 tablet (20 mg total) by mouth 2 (two) times daily as needed for indigestion (itching). 01/26/18   Fawze, Mina A, PA-C  FLUoxetine (PROZAC) 40 MG capsule Take 40 mg by mouth daily.    [provider]  fluticasone (FLONASE) 50 MCG/ACT nasal spray Place 1 spray into both nostrils daily.    [provider]  hyoscyamine (LEVSIN SL) 0.125 MG SL tablet Take 1 tablet by mouth every 6 (six) hours as needed for cramping. 05/26/16   [provider]  nortriptyline (PAMELOR) 10 MG capsule Take 1 capsule (10 mg total) by mouth at bedtime. 03/12/20   Pieter Partridge, DO  omeprazole (PRILOSEC) 20 MG capsule Take 20 mg by mouth daily. 06/09/16  06/09/17  [provider]  oxyCODONE-acetaminophen (PERCOCET/ROXICET) 5-325 MG tablet Take 1 tablet by mouth every 6 (six) hours as needed for severe pain. 11/05/22   Margarita Mail, PA-C  predniSONE (DELTASONE) 10 MG tablet Take 40 mg of prednisone in the morning with food for 3 days, then 30 mg of prednisone for 2 days, then 20 mg of prednisone for 1 day. 01/26/18   Fawze, Mina A, PA-C  triamcinolone ointment (KENALOG) 0.1 % Apply 1 Application topically 2 (two) times daily. Apply sparingly to clean, itchy areas twice daily 04/23/22   Eulogio Bear, NP  UNKNOWN TO PATIENT "An allergy med and a med for my stomach" but unk which ones    [provider]      Allergies    Ivp dye [iodinated contrast media], Poison ivy extract, and Poison oak extract    Review of Systems   Review of Systems  Physical Exam Updated Vital Signs BP 132/84   Pulse 84   Temp 98.5 F (36.9 C) (Oral)   Resp 20   Ht 5\' 4"  (1.626 m)   Wt 83.5 kg   SpO2 99%   BMI 31.58 kg/m  Physical Exam Vitals and nursing note reviewed.  Constitutional:      General: He is not in acute distress.    Appearance: He is well-developed. He is not diaphoretic.  HENT:  Head: Normocephalic and atraumatic.  Eyes:     General: No scleral icterus.    Conjunctiva/sclera: Conjunctivae normal.  Cardiovascular:     Rate and Rhythm: Normal rate and regular rhythm.     Heart sounds: Normal heart sounds.  Pulmonary:     Effort: Pulmonary effort is normal. No respiratory distress.     Breath sounds: Normal breath sounds.  Abdominal:     Palpations: Abdomen is soft.     Tenderness: There is no abdominal tenderness.  Musculoskeletal:     Cervical back: Normal range of motion and neck supple.     Comments: Left middle finger with distal partial tip avulsion including a portion of the nail bed.  This does not involve the proximal nail fold or nail matrix.  There is active pulsatile bleeding.  Skin:    General:  Skin is warm and dry.  Neurological:     Mental Status: He is alert.  Psychiatric:        Behavior: Behavior normal.     ED Results / Procedures / Treatments   Labs (all labs ordered are listed, but only abnormal results are displayed) Labs Reviewed - No data to display  EKG None  Radiology No results found.  Procedures .Nail Removal  Date/Time: 11/06/2022 10:27 AM  Performed by: Margarita Mail, PA-C Authorized by: Margarita Mail, PA-C   Consent:    Consent obtained:  Verbal   Risks discussed:  Bleeding Universal protocol:    Patient identity confirmed:  Arm band Location:    Hand:  L long finger Pre-procedure details:    Skin preparation:  Povidone-iodine Anesthesia:    Anesthesia method:  Nerve block   Block location:  Left middle finger   Block needle gauge:  25 G   Block anesthetic:  Lidocaine 1% w/o epi   Block technique:  MCP   Block injection procedure:  Anatomic landmarks identified   Block outcome:  Anesthesia achieved Comments:     Patient with pulsatile bleeding and nailbed injury with partial avulsion of the distal left finger.  I provided a nerve block for pain control.  I then placed a tourniquet onto the left finger.  I achieved hemostasis first with tourniquet and then applied quick clot styptic powder to the distal end of the finger followed by layered hemostatic gauze agents and Coban.  I then removed the tourniquet.  Patient was watched for a period of 45 minutes without recurrent bleeding or soaking of gauze.     Medications Ordered in ED Medications  lidocaine (PF) (XYLOCAINE) 1 % injection 10 mL (10 mLs Infiltration Given 11/05/22 2240)  lidocaine (PF) (XYLOCAINE) 1 % injection 10 mL (10 mLs Infiltration Given 11/05/22 2240)    ED Course/ Medical Decision Making/ A&P                             Medical Decision Making Patient here with avulsion injury of the distal middle left finger.  After pain control with anesthesia and nerve block I  was able to achieve hemostasis using clotting agents hemostatic gauze.  Patient given home return precautions, wound care precautions, work note.  He is up-to-date on his tetanus vaccination.  His wound was cleaned thoroughly.  Patient appears otherwise appropriate for discharge at this time.  Return precautions discussed  PDMP reviewed during this encounter. Patient sent home with 6 tablets of Percocet per [facility] prepack  Risk Prescription drug management.  Final Clinical Impression(s) / ED Diagnoses Final diagnoses:  Soft tissue avulsion  Injury of finger of left hand, initial encounter    Rx / DC Orders ED Discharge Orders          Ordered    oxyCODONE-acetaminophen (PERCOCET/ROXICET) 5-325 MG tablet  Every 6 hours PRN,   Status:  Discontinued        11/05/22 2332    oxyCODONE-acetaminophen (PERCOCET/ROXICET) 5-325 MG tablet  Every 6 hours PRN,   Status:  Discontinued        11/05/22 2349    oxyCODONE-acetaminophen (PERCOCET/ROXICET) 5-325 MG tablet  Every 6 hours PRN        11/05/22 2352              Margarita Mail, PA-C 11/06/22 1031    Milton Ferguson, MD 11/07/22 1040

## 2022-11-05 NOTE — ED Triage Notes (Signed)
Pt presents with a lac to the L middle finger. Wound is bandaged.

## 2022-11-05 NOTE — Discharge Instructions (Addendum)
If you have return of bleeding to the finger apply direct pressure for 20 minutes without letting up and apply very firm pressure.  Should help to stop the bleeding.  Make sure to ice and elevate your hand above your heart this may help reduce throbbing pain in the finger.  WOUND CARE  Keep area clean and dry for 24 hours. Do not remove bandage, if applied.  After 24 hours, remove bandage and you may wash around the wound but do not apply water directly to the wound itself. reapply a new bandage  Once you have had no recurrence of bleeding and it appears that you have a firm scab over the wound you may begin cleansing the area.  I would give this at least 5 days.  Do not apply any ointments or creams to the wound   Seek medical careif you experience any of the following signs of infection: Swelling, redness, pus drainage, streaking, fever >101.0 F  Seek care if you experience excessive bleeding that does not stop after 15-20 minutes of constant, firm pressure.

## 2022-11-06 MED FILL — Oxycodone w/ Acetaminophen Tab 5-325 MG: ORAL | Qty: 6 | Status: AC

## 2022-11-09 DIAGNOSIS — Z1329 Encounter for screening for other suspected endocrine disorder: Secondary | ICD-10-CM | POA: Diagnosis not present

## 2022-11-09 DIAGNOSIS — Z1159 Encounter for screening for other viral diseases: Secondary | ICD-10-CM | POA: Diagnosis not present

## 2022-11-09 DIAGNOSIS — Z13220 Encounter for screening for lipoid disorders: Secondary | ICD-10-CM | POA: Diagnosis not present

## 2022-11-09 DIAGNOSIS — R7301 Impaired fasting glucose: Secondary | ICD-10-CM | POA: Diagnosis not present

## 2022-11-10 LAB — CBC WITH DIFFERENTIAL/PLATELET
Basophils Absolute: 0.1 10*3/uL (ref 0.0–0.2)
Basos: 1 %
EOS (ABSOLUTE): 0.3 10*3/uL (ref 0.0–0.4)
Eos: 3 %
Hematocrit: 47 % (ref 37.5–51.0)
Hemoglobin: 15.8 g/dL (ref 13.0–17.7)
Immature Grans (Abs): 0 10*3/uL (ref 0.0–0.1)
Immature Granulocytes: 0 %
Lymphocytes Absolute: 2 10*3/uL (ref 0.7–3.1)
Lymphs: 22 %
MCH: 28.7 pg (ref 26.6–33.0)
MCHC: 33.6 g/dL (ref 31.5–35.7)
MCV: 85 fL (ref 79–97)
Monocytes Absolute: 0.8 10*3/uL (ref 0.1–0.9)
Monocytes: 9 %
Neutrophils Absolute: 5.8 10*3/uL (ref 1.4–7.0)
Neutrophils: 65 %
Platelets: 415 10*3/uL (ref 150–450)
RBC: 5.51 x10E6/uL (ref 4.14–5.80)
RDW: 11.9 % (ref 11.6–15.4)
WBC: 9 10*3/uL (ref 3.4–10.8)

## 2022-11-10 LAB — CMP14+EGFR
ALT: 19 IU/L (ref 0–44)
AST: 12 IU/L (ref 0–40)
Albumin/Globulin Ratio: 1.5 (ref 1.2–2.2)
Albumin: 4.5 g/dL (ref 4.3–5.2)
Alkaline Phosphatase: 102 IU/L (ref 44–121)
BUN/Creatinine Ratio: 10 (ref 9–20)
BUN: 9 mg/dL (ref 6–20)
Bilirubin Total: 0.4 mg/dL (ref 0.0–1.2)
CO2: 25 mmol/L (ref 20–29)
Calcium: 9.7 mg/dL (ref 8.7–10.2)
Chloride: 104 mmol/L (ref 96–106)
Creatinine, Ser: 0.89 mg/dL (ref 0.76–1.27)
Globulin, Total: 3.1 g/dL (ref 1.5–4.5)
Glucose: 94 mg/dL (ref 70–99)
Potassium: 3.8 mmol/L (ref 3.5–5.2)
Sodium: 141 mmol/L (ref 134–144)
Total Protein: 7.6 g/dL (ref 6.0–8.5)
eGFR: 121 mL/min/{1.73_m2} (ref >59)

## 2022-11-10 LAB — LIPID PANEL
Chol/HDL Ratio: 3.8 ratio (ref 0.0–5.0)
Cholesterol, Total: 127 mg/dL (ref 100–199)
HDL: 33 mg/dL — ABNORMAL LOW (ref >39)
LDL Chol Calc (NIH): 72 mg/dL (ref 0–99)
Triglycerides: 119 mg/dL (ref 0–149)
VLDL Cholesterol Cal: 22 mg/dL (ref 5–40)

## 2022-11-10 LAB — HEMOGLOBIN A1C
Est. average glucose Bld gHb Est-mCnc: 103 mg/dL
Hgb A1c MFr Bld: 5.2 % (ref 4.8–5.6)

## 2022-11-10 LAB — TSH+FREE T4
Free T4: 0.97 ng/dL (ref 0.82–1.77)
TSH: 1.23 u[IU]/mL (ref 0.450–4.500)

## 2022-11-10 LAB — HEPATITIS C ANTIBODY: Hep C Virus Ab: NONREACTIVE

## 2022-11-13 ENCOUNTER — Ambulatory Visit (INDEPENDENT_AMBULATORY_CARE_PROVIDER_SITE_OTHER): Payer: 59 | Admitting: Family Medicine

## 2022-11-13 ENCOUNTER — Encounter: Payer: Self-pay | Admitting: Family Medicine

## 2022-11-13 VITALS — BP 126/80 | HR 89 | Ht 64.0 in | Wt 183.0 lb

## 2022-11-13 DIAGNOSIS — S6992XD Unspecified injury of left wrist, hand and finger(s), subsequent encounter: Secondary | ICD-10-CM | POA: Diagnosis not present

## 2022-11-13 DIAGNOSIS — T7840XA Allergy, unspecified, initial encounter: Secondary | ICD-10-CM | POA: Diagnosis not present

## 2022-11-13 DIAGNOSIS — Z8719 Personal history of other diseases of the digestive system: Secondary | ICD-10-CM

## 2022-11-13 DIAGNOSIS — S6992XA Unspecified injury of left wrist, hand and finger(s), initial encounter: Secondary | ICD-10-CM | POA: Insufficient documentation

## 2022-11-13 DIAGNOSIS — L089 Local infection of the skin and subcutaneous tissue, unspecified: Secondary | ICD-10-CM | POA: Diagnosis not present

## 2022-11-13 MED ORDER — CEPHALEXIN 500 MG PO CAPS: 500.0000 mg | ORAL_CAPSULE | Freq: Four times a day (QID) | ORAL | 0 refills | Status: DC

## 2022-11-13 MED ORDER — FLUTICASONE PROPIONATE 50 MCG/ACT NA SUSP: 1.0000 | Freq: Every day | NASAL | 2 refills | Status: DC

## 2022-11-13 MED ORDER — CETIRIZINE HCL 10 MG PO TABS: 10.0000 mg | ORAL_TABLET | Freq: Every day | ORAL | 3 refills | Status: DC

## 2022-11-13 MED ORDER — CEPHALEXIN 500 MG PO CAPS: 500.0000 mg | ORAL_CAPSULE | Freq: Two times a day (BID) | ORAL | 0 refills | Status: AC

## 2022-11-13 MED ORDER — OMEPRAZOLE 20 MG PO CPDR: 20.0000 mg | DELAYED_RELEASE_CAPSULE | Freq: Every day | ORAL | 11 refills | Status: DC

## 2022-11-13 NOTE — Progress Notes (Signed)
Patient Office Visit   Subjective   Patient ID: Andrew Lynch, male    DOB: 05/16/1996  Age: 27 y.o. MRN: [id number]  CC:  Chief Complaint  Patient presents with   Wound Check    Patient states he cut his L middle finger at work on 3/28. Was seen in ED, it continues to hurt.    HPI Willy EddyRichard Austin Tatsch 27 year old male, presents to clinic for ER follow up for left middle finger injury.He  has a past medical history of ADHD (attention deficit hyperactivity disorder), Goiter, Movement disorder, Physical growth delay, Poor appetite, Puberty delay, and Stomach pain. Refer to plan and assessment for details of today visit.  HPI   Outpatient Encounter Medications as of 11/13/2022  Medication Sig   busPIRone (BUSPAR) 10 MG tablet Take 10 mg by mouth 2 (two) times daily.   diphenhydrAMINE (BENADRYL) 25 MG tablet Take 1 tablet (25 mg total) by mouth every 6 (six) hours as needed for itching.   famotidine (PEPCID) 20 MG tablet Take 1 tablet (20 mg total) by mouth 2 (two) times daily as needed for indigestion (itching).   FLUoxetine (PROZAC) 40 MG capsule Take 40 mg by mouth daily.   hyoscyamine (LEVSIN SL) 0.125 MG SL tablet Take 1 tablet by mouth every 6 (six) hours as needed for cramping.   nortriptyline (PAMELOR) 10 MG capsule Take 1 capsule (10 mg total) by mouth at bedtime.   oxyCODONE-acetaminophen (PERCOCET/ROXICET) 5-325 MG tablet Take 1 tablet by mouth every 6 (six) hours as needed for severe pain.   predniSONE (DELTASONE) 10 MG tablet Take 40 mg of prednisone in the morning with food for 3 days, then 30 mg of prednisone for 2 days, then 20 mg of prednisone for 1 day.   triamcinolone ointment (KENALOG) 0.1 % Apply 1 Application topically 2 (two) times daily. Apply sparingly to clean, itchy areas twice daily   UNKNOWN TO PATIENT "An allergy med and a med for my stomach" but unk which ones   [DISCONTINUED] cephALEXin (KEFLEX) 500 MG capsule Take 1 capsule (500 mg total) by mouth 4  (four) times daily for 5 days.   [DISCONTINUED] cetirizine (ZYRTEC) 10 MG tablet Take 10 mg by mouth daily.   [DISCONTINUED] fluticasone (FLONASE) 50 MCG/ACT nasal spray Place 1 spray into both nostrils daily.   cephALEXin (KEFLEX) 500 MG capsule Take 1 capsule (500 mg total) by mouth 2 (two) times daily for 5 days.   cetirizine (ZYRTEC) 10 MG tablet Take 1 tablet (10 mg total) by mouth daily.   fluticasone (FLONASE) 50 MCG/ACT nasal spray Place 1 spray into both nostrils daily.   omeprazole (PRILOSEC) 20 MG capsule Take 1 capsule (20 mg total) by mouth daily.   [DISCONTINUED] omeprazole (PRILOSEC) 20 MG capsule Take 20 mg by mouth daily.   No facility-administered encounter medications on file as of 11/13/2022.    Past Surgical History:  Procedure Laterality Date   SMALL INTESTINE SURGERY      Review of Systems  Constitutional:  Negative for chills and fever.  Respiratory:  Negative for shortness of breath.   Cardiovascular:  Negative for chest pain.  Gastrointestinal:  Negative for abdominal pain, nausea and vomiting.  Genitourinary:  Negative for dysuria.  Skin:        Left middle finger with dried blood  Neurological:  Negative for dizziness and headaches.      Objective    BP 126/80   Pulse 89   Ht 5\' 4"  (  1.626 m)   Wt 183 lb (83 kg)   SpO2 97%   BMI 31.41 kg/m   Physical Exam Constitutional:      Appearance: He is normal weight.  Cardiovascular:     Rate and Rhythm: Normal rate and regular rhythm.     Pulses: Normal pulses.     Heart sounds: Normal heart sounds.  Pulmonary:     Effort: Pulmonary effort is normal.     Breath sounds: Normal breath sounds.  Musculoskeletal:        General: Tenderness and signs of injury present. No swelling.     Right hand: No tenderness. Normal range of motion.     Left hand: Tenderness present. Decreased range of motion.     Comments: Left middle finger still healing, dried blood noted, erythema surrounding finger, removal of  partial nail bed.  Skin:    General: Skin is warm and dry.     Capillary Refill: Capillary refill takes less than 2 seconds.  Neurological:     General: No focal deficit present.     Coordination: Coordination normal.     Gait: Gait normal.  Psychiatric:        Mood and Affect: Mood normal.       Assessment & Plan:  Skin infection -     Cephalexin; Take 1 capsule (500 mg total) by mouth 2 (two) times daily for 5 days.  Dispense: 10 capsule; Refill: 0  History of indigestion -     Omeprazole; Take 1 capsule (20 mg total) by mouth daily.  Dispense: 30 capsule; Refill: 11  Allergy, initial encounter -     Fluticasone Propionate; Place 1 spray into both nostrils daily.  Dispense: 16 g; Refill: 2 -     Cetirizine HCl; Take 1 tablet (10 mg total) by mouth daily.  Dispense: 30 tablet; Refill: 3  Injury of left middle finger, subsequent encounter Assessment & Plan: Start keflex 500 mg x 5 days prophylactically Advise wound cleaning daily with normal saline  and patch dry with gauze wrap with bandage.      No follow-ups on file.   Cruzita Lederer Newman Nip, FNP

## 2022-11-13 NOTE — Assessment & Plan Note (Signed)
Start keflex 500 mg x 5 days prophylactically Advise wound cleaning daily with normal saline  and patch dry with gauze wrap with bandage.

## 2022-11-13 NOTE — Patient Instructions (Signed)
It was pleasure meeting with you today. Please take medications as prescribed. Follow up with your primary health provider if any health concerns arises. If symptoms worsen please contact your primary care provider and/or visit the emergency department.  

## 2023-03-15 ENCOUNTER — Ambulatory Visit: Payer: 59 | Admitting: Family Medicine

## 2023-04-02 ENCOUNTER — Encounter: Payer: Self-pay | Admitting: Family Medicine

## 2023-04-02 ENCOUNTER — Ambulatory Visit (INDEPENDENT_AMBULATORY_CARE_PROVIDER_SITE_OTHER): Payer: 59 | Admitting: Family Medicine

## 2023-04-02 VITALS — BP 137/87 | HR 66 | Ht 64.0 in | Wt 180.1 lb

## 2023-04-02 DIAGNOSIS — K649 Unspecified hemorrhoids: Secondary | ICD-10-CM

## 2023-04-02 MED ORDER — HYDROCORTISONE ACETATE 25 MG RE SUPP: 25.0000 mg | Freq: Two times a day (BID) | RECTAL | 0 refills | Status: DC

## 2023-04-02 NOTE — Progress Notes (Signed)
Patient Office Visit   Subjective   Patient ID: Andrew Lynch, male    DOB: 1996-07-01  Age: 27 y.o. MRN: [id number]  CC:  Chief Complaint  Patient presents with   Personal Problem    Bump on private area causing discomfort    HPI Andrew Austin [name]27 year old male, presents to the clinic for rectal bump causing discomfort. He  has a past medical history of ADHD (attention deficit hyperactivity disorder), Goiter, Movement disorder, Physical growth delay, Poor appetite, Puberty delay, and Stomach pain.For the details of today's visit, please refer to assessment and plan.   HPI    Outpatient Encounter Medications as of 04/02/2023  Medication Sig   hydrocortisone (ANUSOL-HC) 25 MG suppository Place 1 suppository (25 mg total) rectally 2 (two) times daily.   [DISCONTINUED] busPIRone (BUSPAR) 10 MG tablet Take 10 mg by mouth 2 (two) times daily. (Patient not taking: Reported on 04/02/2023)   [DISCONTINUED] cetirizine (ZYRTEC) 10 MG tablet Take 1 tablet (10 mg total) by mouth daily. (Patient not taking: Reported on 04/02/2023)   [DISCONTINUED] diphenhydrAMINE (BENADRYL) 25 MG tablet Take 1 tablet (25 mg total) by mouth every 6 (six) hours as needed for itching. (Patient not taking: Reported on 04/02/2023)   [DISCONTINUED] famotidine (PEPCID) 20 MG tablet Take 1 tablet (20 mg total) by mouth 2 (two) times daily as needed for indigestion (itching). (Patient not taking: Reported on 04/02/2023)   [DISCONTINUED] FLUoxetine (PROZAC) 40 MG capsule Take 40 mg by mouth daily. (Patient not taking: Reported on 04/02/2023)   [DISCONTINUED] fluticasone (FLONASE) 50 MCG/ACT nasal spray Place 1 spray into both nostrils daily. (Patient not taking: Reported on 04/02/2023)   [DISCONTINUED] hyoscyamine (LEVSIN SL) 0.125 MG SL tablet Take 1 tablet by mouth every 6 (six) hours as needed for cramping. (Patient not taking: Reported on 04/02/2023)   [DISCONTINUED] nortriptyline (PAMELOR) 10 MG capsule Take 1  capsule (10 mg total) by mouth at bedtime. (Patient not taking: Reported on 04/02/2023)   [DISCONTINUED] omeprazole (PRILOSEC) 20 MG capsule Take 1 capsule (20 mg total) by mouth daily. (Patient not taking: Reported on 04/02/2023)   [DISCONTINUED] oxyCODONE-acetaminophen (PERCOCET/ROXICET) 5-325 MG tablet Take 1 tablet by mouth every 6 (six) hours as needed for severe pain. (Patient not taking: Reported on 04/02/2023)   [DISCONTINUED] predniSONE (DELTASONE) 10 MG tablet Take 40 mg of prednisone in the morning with food for 3 days, then 30 mg of prednisone for 2 days, then 20 mg of prednisone for 1 day. (Patient not taking: Reported on 04/02/2023)   [DISCONTINUED] triamcinolone ointment (KENALOG) 0.1 % Apply 1 Application topically 2 (two) times daily. Apply sparingly to clean, itchy areas twice daily (Patient not taking: Reported on 04/02/2023)   [DISCONTINUED] UNKNOWN TO PATIENT "An allergy med and a med for my stomach" but unk which ones (Patient not taking: Reported on 04/02/2023)   No facility-administered encounter medications on file as of 04/02/2023.    Past Surgical History:  Procedure Laterality Date   SMALL INTESTINE SURGERY      Review of Systems  Constitutional:  Negative for chills and fever.  Respiratory:  Negative for shortness of breath.   Cardiovascular:  Negative for chest pain.  Gastrointestinal:  Negative for abdominal pain, blood in stool, constipation, diarrhea and melena.  Neurological:  Negative for dizziness and headaches.      Objective    BP 137/87 (BP Location: Right Arm, Patient Position: Sitting, Cuff Size: Large)   Pulse 66   Ht 5\' 4"  (1.626  m)   Wt 180 lb 1.3 oz (81.7 kg)   SpO2 97%   BMI 30.91 kg/m   Physical Exam Vitals reviewed.  Constitutional:      General: He is not in acute distress.    Appearance: Normal appearance. He is not ill-appearing, toxic-appearing or diaphoretic.  HENT:     Head: Normocephalic.  Eyes:     General:        Right eye:  No discharge.        Left eye: No discharge.     Conjunctiva/sclera: Conjunctivae normal.  Cardiovascular:     Rate and Rhythm: Normal rate.     Pulses: Normal pulses.     Heart sounds: Normal heart sounds.  Pulmonary:     Effort: Pulmonary effort is normal. No respiratory distress.     Breath sounds: Normal breath sounds.  Abdominal:     Palpations: Abdomen is soft.     Tenderness: There is no abdominal tenderness. There is no guarding.  Genitourinary:    Rectum: Normal.  Musculoskeletal:     Cervical back: Normal range of motion.  Skin:    General: Skin is warm and dry.     Capillary Refill: Capillary refill takes less than 2 seconds.  Neurological:     General: No focal deficit present.     Mental Status: He is alert and oriented to person, place, and time.     Coordination: Coordination normal.     Gait: Gait normal.  Psychiatric:        Mood and Affect: Mood normal.        Behavior: Behavior normal.       Assessment & Plan:  Hemorrhoids, unspecified hemorrhoid type Assessment & Plan: Good rectal tone on assessment. Grade 1 Hemorrhoid located inside the rectum no visible externally,  Hemorid does not protrude outside the anal canal Patient denies rectal pain or bleeding Trial Hydrocortisone suppository 2 times daily  Orders: -     Hydrocortisone Acetate; Place 1 suppository (25 mg total) rectally 2 (two) times daily.  Dispense: 12 suppository; Refill: 0    No follow-ups on file.   Cruzita Lederer Newman Nip, FNP

## 2023-04-02 NOTE — Assessment & Plan Note (Signed)
Good rectal tone on assessment. Grade 1 Hemorrhoid located inside the rectum no visible externally,  Hemorid does not protrude outside the anal canal Patient denies rectal pain or bleeding Trial Hydrocortisone suppository 2 times daily

## 2023-04-02 NOTE — Patient Instructions (Signed)
        Great to see you today.   - Please take medications as prescribed. - Follow up with your primary health provider if any health concerns arises. - If symptoms worsen please contact your primary care provider and/or visit the emergency department.  

## 2023-07-29 ENCOUNTER — Telehealth: Payer: Self-pay | Admitting: Family Medicine

## 2023-07-29 NOTE — Telephone Encounter (Signed)
Patient wanting to switch from [facility] [facility] to [facility] due to upcoming schedule change- pt is only available to come in on Mondays and Tuesdays. Is this okay? Please advise  Thank you

## 2023-07-29 NOTE — Telephone Encounter (Signed)
That's fine

## 2023-07-29 NOTE — Telephone Encounter (Signed)
Thanks fine

## 2023-12-13 ENCOUNTER — Ambulatory Visit: Payer: Self-pay

## 2023-12-13 NOTE — Telephone Encounter (Signed)
 Copied from CRM [id number]. Topic: Clinical - Red Word Triage >> Dec 13, 2023  1:25 PM Zipporah Him wrote: Red Word that prompted transfer to Nurse Triage: Patient has been having issues with sinus problems and is very  very uncomfortable. Possibly has a fever but no thermometer. Can't sleep.  Chief Complaint: sinus pain and pressure Symptoms: pain behind eyes and all over head, fever Frequency: started last night Pertinent Negatives: Patient denies cp, sob Disposition: [] ED /[] Urgent Care (no appt availability in office) / [x] Appointment(In office/virtual)/ []  [facility]/ [] Home Care/ [] Refused Recommended Disposition /[] [facility]/ []  Follow-up with PCP Additional Notes: per protocol apt made for tomorrow; care advice given, denies questions; instructed to go to ER if becomes worse.   Reason for Disposition  [1] Sinus pain (not just congestion) AND [2] fever  Answer Assessment - Initial Assessment Questions 1. LOCATION: "Where does it hurt?"      Top of head, behind eyes 2. ONSET: "When did the sinus pain start?"  (e.g., hours, days)      Last night 3. SEVERITY: "How bad is the pain?"   (Scale 1-10; mild, moderate or severe)   - MILD (1-3): doesn't interfere with normal activities    - MODERATE (4-7): interferes with normal activities (e.g., work or school) or awakens from sleep   - SEVERE (8-10): excruciating pain and patient unable to do any normal activities        mild 4. RECURRENT SYMPTOM: "Have you ever had sinus problems before?" If Yes, ask: "When was the last time?" and "What happened that time?"      yes 5. NASAL CONGESTION: "Is the nose blocked?" If Yes, ask: "Can you open it or must you breathe through your mouth?"     Stuffy and runny 6. NASAL DISCHARGE: "Do you have discharge from your nose?" If so ask, "What color?"     clear 7. FEVER: "Do you have a fever?" If Yes, ask: "What is it, how was it measured, and when did it start?"      Yes, unable to  measure 8. OTHER SYMPTOMS: "Do you have any other symptoms?" (e.g., sore throat, cough, earache, difficulty breathing)     Stuffy nose causing difficulty breathing.  9. PREGNANCY: "Is there any chance you are pregnant?" "When was your last menstrual period?"     na  Protocols used: Sinus Pain or Congestion-A-AH

## 2023-12-14 ENCOUNTER — Ambulatory Visit (INDEPENDENT_AMBULATORY_CARE_PROVIDER_SITE_OTHER): Payer: Self-pay

## 2023-12-14 ENCOUNTER — Telehealth: Payer: Self-pay | Admitting: Family Medicine

## 2023-12-14 VITALS — BP 129/82 | HR 77 | Ht 62.0 in | Wt 186.1 lb

## 2023-12-14 DIAGNOSIS — J0100 Acute maxillary sinusitis, unspecified: Secondary | ICD-10-CM

## 2023-12-14 MED ORDER — IBUPROFEN 600 MG PO TABS: 600.0000 mg | ORAL_TABLET | Freq: Three times a day (TID) | ORAL | 0 refills | Status: DC | PRN

## 2023-12-14 MED ORDER — AMOXICILLIN 875 MG PO TABS: 875.0000 mg | ORAL_TABLET | Freq: Two times a day (BID) | ORAL | 0 refills | Status: AC

## 2023-12-14 NOTE — Telephone Encounter (Signed)
 Appointment scheduled.

## 2023-12-14 NOTE — Progress Notes (Unsigned)
   Acute Office Visit  Subjective:     Patient ID: Andrew Lynch, male    DOB: 1996/03/24, 28 y.o.   MRN: [id number]  Chief Complaint  Patient presents with   Medical Management of Chronic Issues    Sinus pain and bowel    HPI Patient is in today for Sinusitis: Patient presents with chronic sinusitis. The {companion:5061::"patient"} reports chronic sinus infections for {numbers 0-10:33138} {time units:11}.  His symptoms include {hpi associate symptoms sinusitis:15001}.  There has not been a history of {hpi assoc symptoms sinusitis:15001}. There {hpi assoc has/has/not:15037} been a history of chronic otitis media or pharyngotonsillitis.  Prior antibiotic therapy has included {hpi context prev_abx:14865}. Other medications have included {sinusitis other medications:15002}.  He {hpi assoc has/has/not:15037} had allergy testing which was {positive/negative:15184}.     ROS      Objective:    BP 129/82   Pulse 77   Ht 5\' 2"  (1.575 m)   Wt 186 lb 1.9 oz (84.4 kg)   SpO2 96%   BMI 34.04 kg/m  {Vitals History (Optional):23777}  Physical Exam  No results found for any visits on 12/14/23.      Assessment & Plan:   Problem List Items Addressed This Visit   None   No orders of the defined types were placed in this encounter.   No follow-ups on file.  Alison Irvine, FNP

## 2023-12-14 NOTE — Telephone Encounter (Signed)
 Patient is requesting provider change.  Wants to see NP Rice Chamorro   Please advise

## 2023-12-15 NOTE — Telephone Encounter (Signed)
 That's fine

## 2023-12-15 NOTE — Telephone Encounter (Signed)
 Noted on chart in patient message.

## 2024-01-25 ENCOUNTER — Ambulatory Visit: Payer: Self-pay

## 2024-01-25 ENCOUNTER — Emergency Department (HOSPITAL_COMMUNITY)

## 2024-01-25 ENCOUNTER — Emergency Department (HOSPITAL_COMMUNITY)
Admission: EM | Admit: 2024-01-25 | Discharge: 2024-01-25 | Disposition: A | Discharge status: Home / Self Care | Attending: Emergency Medicine | Admitting: Emergency Medicine

## 2024-01-25 ENCOUNTER — Encounter (HOSPITAL_COMMUNITY): Payer: Self-pay

## 2024-01-25 ENCOUNTER — Other Ambulatory Visit: Payer: Self-pay

## 2024-01-25 DIAGNOSIS — R0789 Other chest pain: Secondary | ICD-10-CM | POA: Diagnosis not present

## 2024-01-25 DIAGNOSIS — R079 Chest pain, unspecified: Secondary | ICD-10-CM | POA: Diagnosis not present

## 2024-01-25 DIAGNOSIS — Z87891 Personal history of nicotine dependence: Secondary | ICD-10-CM | POA: Diagnosis not present

## 2024-01-25 DIAGNOSIS — R42 Dizziness and giddiness: Secondary | ICD-10-CM | POA: Diagnosis not present

## 2024-01-25 DIAGNOSIS — R0602 Shortness of breath: Secondary | ICD-10-CM | POA: Diagnosis not present

## 2024-01-25 HISTORY — DX: Gastro-esophageal reflux disease without esophagitis: K21.9

## 2024-01-25 LAB — CBC WITH DIFFERENTIAL/PLATELET
Abs Immature Granulocytes: 0.02 10*3/uL (ref 0.00–0.07)
Basophils Absolute: 0.1 10*3/uL (ref 0.0–0.1)
Basophils Relative: 1 %
Eosinophils Absolute: 0.2 10*3/uL (ref 0.0–0.5)
Eosinophils Relative: 3 %
HCT: 47.1 % (ref 39.0–52.0)
Hemoglobin: 16.7 g/dL (ref 13.0–17.0)
Immature Granulocytes: 0 %
Lymphocytes Relative: 28 %
Lymphs Abs: 2 10*3/uL (ref 0.7–4.0)
MCH: 30.1 pg (ref 26.0–34.0)
MCHC: 35.5 g/dL (ref 30.0–36.0)
MCV: 84.9 fL (ref 80.0–100.0)
Monocytes Absolute: 0.6 10*3/uL (ref 0.1–1.0)
Monocytes Relative: 9 %
Neutro Abs: 4.2 10*3/uL (ref 1.7–7.7)
Neutrophils Relative %: 59 %
Platelets: 364 10*3/uL (ref 150–400)
RBC: 5.55 MIL/uL (ref 4.22–5.81)
RDW: 12 % (ref 11.5–15.5)
WBC: 7.1 10*3/uL (ref 4.0–10.5)
nRBC: 0 % (ref 0.0–0.2)

## 2024-01-25 LAB — COMPREHENSIVE METABOLIC PANEL WITH GFR
ALT: 35 U/L (ref 0–44)
AST: 19 U/L (ref 15–41)
Albumin: 4.4 g/dL (ref 3.5–5.0)
Alkaline Phosphatase: 80 U/L (ref 38–126)
Anion gap: 10 (ref 5–15)
BUN: 10 mg/dL (ref 6–20)
CO2: 26 mmol/L (ref 22–32)
Calcium: 9.4 mg/dL (ref 8.9–10.3)
Chloride: 101 mmol/L (ref 98–111)
Creatinine, Ser: 0.96 mg/dL (ref 0.61–1.24)
GFR, Estimated: >60 mL/min (ref >60)
Glucose, Bld: 101 mg/dL — ABNORMAL HIGH (ref 70–99)
Potassium: 4 mmol/L (ref 3.5–5.1)
Sodium: 137 mmol/L (ref 135–145)
Total Bilirubin: 1.1 mg/dL (ref 0.0–1.2)
Total Protein: 8 g/dL (ref 6.5–8.1)

## 2024-01-25 LAB — TROPONIN I (HIGH SENSITIVITY): Troponin I (High Sensitivity): <2 ng/L (ref <18)

## 2024-01-25 NOTE — Telephone Encounter (Signed)
 FYI Only or Action Required?: FYI only for provider  Patient was last seen in primary care on 12/14/2023 by Andrew Irvine, FNP. Called Nurse Triage reporting Chest Pain. Symptoms began a week ago. Interventions attempted: Nothing. Symptoms are: Chest tightness, chest pain sensation of pressure on chest with exertion.gradually worsening.  Triage Disposition: Go to ED or PCP/Alternative with Approval  Patient/caregiver understands and will follow disposition?: Yes           Copied from CRM [id number]. Topic: Clinical - Red Word Triage >> Jan 25, 2024  1:47 PM Andrew Lynch wrote: Red Word that prompted transfer to Nurse Triage: Severe chest pains whenever he is doing any physical activity and wants to be seen. This has been happening for a week now. Reason for Disposition  [1] Chest pain lasts > 5 minutes AND [2] occurred in past 3 days (72 hours) (Exception: Feels exactly the same as previously diagnosed heartburn and has accompanying sour taste in mouth.)  Answer Assessment - Initial Assessment Questions 1. LOCATION: Where does it hurt?       Middle of chest 2. RADIATION: Does the pain go anywhere else? (e.g., into neck, jaw, arms, back)     To right side 3. ONSET: When did the chest pain begin? (Minutes, hours or days)      1 week 4. PATTERN: Does the pain come and go, or has it been constant since it started?  Does it get worse with exertion?      Comes and goes 5. DURATION: How long does it last (e.g., seconds, minutes, hours)     1-4 hours 6. SEVERITY: How bad is the pain?  (e.g., Scale 1-10; mild, moderate, or severe)    - MILD (1-3): doesn't interfere with normal activities     - MODERATE (4-7): interferes with normal activities or awakens from sleep    - SEVERE (8-10): excruciating pain, unable to do any normal activities       moderate 7. CARDIAC RISK FACTORS: Do you have any history of heart problems or risk factors for heart disease? (e.g., angina, prior heart  attack; diabetes, high blood pressure, high cholesterol, smoker, or strong family history of heart disease)     no 8. PULMONARY RISK FACTORS: Do you have any history of lung disease?  (e.g., blood clots in lung, asthma, emphysema, birth control pills)     no 9. CAUSE: What do you think is causing the chest pain?     Acid reflux 10. OTHER SYMPTOMS: Do you have any other symptoms? (e.g., dizziness, nausea, vomiting, sweating, fever, difficulty breathing, cough)       Chest tightness - difficulty breathing -  Protocols used: Chest Pain-A-AH

## 2024-01-25 NOTE — ED Provider Notes (Signed)
 [place] EMERGENCY DEPARTMENT AT [facility] Provider Note  CSN: [id number] Arrival date & time: 01/25/24 1453  Chief Complaint(s) Chest Pain  HPI Andrew Lynch is a 28 y.o. male history of GERD presenting to the emergency department with chest pain.  Patient reports chest pain a few days ago, associated with mild shortness of breath, lightheadedness and dizziness.  He also reports that he was having some anxiety during this episode and has been under increased stress.  He also had some burning sensation, felt that this pain and burning was better after belching.  Felt like it could be due to his prior GERD, currently not taking anything, is previously on omeprazole  but stopped as that he felt it was causing diarrhea.  Has not had any subsequent episodes and currently feels at baseline.  Try to follow-up with his primary doctor today, call them, and they told him to come to the ER instead of seeing them in office..  Denies any recent travel, surgery, history of DVT or PE, history of family coronary artery disease at a young age, sudden cardiac death   Past Medical History Past Medical History:  Diagnosis Date   ADHD (attention deficit hyperactivity disorder)    GERD (gastroesophageal reflux disease)    Goiter    Movement disorder    Physical growth delay    Poor appetite    Puberty delay    Stomach pain    Patient Active Problem List   Diagnosis Date Noted   Hemorrhoid 04/02/2023   Injury of left middle finger 11/13/2022   Encounter for routine adult physical exam with abnormal findings 11/03/2022   Acne vulgaris 05/01/2017   Seasonal allergic rhinitis due to pollen 05/01/2017   MDD (major depressive disorder), recurrent episode, moderate (HCC) 06/10/2016   Allergic rhinitis 01/03/2015   Gastroesophageal reflux disease without esophagitis 01/03/2015   Persistent mood disorder (HCC) 01/03/2015   Thyroiditis, autoimmune 07/10/2014   Benign paroxysmal positional  vertigo 02/21/2013   Dyspepsia 02/21/2013   Orthostatic hypotension 11/21/2012   Physical growth delay    ADHD (attention deficit hyperactivity disorder)    Goiter    Puberty delay    Poor appetite    Stomach pain    Lack of expected normal physiological development in childhood 01/21/2011   Goiter 01/21/2011   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 Authorizing Provider  ibuprofen  (ADVIL ) 600 MG tablet Take 1 tablet (600 mg total) by mouth every 8 (eight) hours as needed. 12/14/23   Alison Irvine, FNP                                                                                                                                    Past Surgical History Past Surgical History:  Procedure Laterality Date   SMALL INTESTINE SURGERY  Family History Family History  Problem Relation Age of Onset   Bipolar disorder Mother    Depression Mother    Anxiety disorder Mother    Insomnia Mother    Thyroid  cancer Father    Hypertension Father    Diabetes Paternal Uncle    Hypertension Maternal Grandmother    Heart attack Maternal Grandmother    Diabetes Maternal Grandfather    Heart disease Maternal Grandfather    Heart attack Maternal Grandfather    Stroke Maternal Grandfather    Epilepsy Maternal Grandfather    Thyroid  disease Neg Hx     Social History Social History   Tobacco Use   Smoking status: Former    Types: Cigarettes    Passive exposure: Past   Smokeless tobacco: Never  Vaping Use   Vaping status: Former  Substance Use Topics   Alcohol use: No   Drug use: No   Allergies Ivp dye [iodinated contrast media], Poison ivy extract, Poison oak extract, and Poison sumac extract  Review of Systems Review of Systems  All other systems reviewed and are negative.   Physical Exam Vital Signs  I have reviewed the triage vital signs BP  135/87 (BP Location: Right Arm)   Pulse 69   Temp 97.8 F (36.6 C)   Resp 18   Ht 5' 2 (1.575 m)   Wt 81.6 kg   SpO2 99%   BMI 32.92 kg/m  Physical Exam Vitals and nursing note reviewed.  Constitutional:      General: He is not in acute distress.    Appearance: Normal appearance.  HENT:     Mouth/Throat:     Mouth: Mucous membranes are moist.   Eyes:     Conjunctiva/sclera: Conjunctivae normal.    Cardiovascular:     Rate and Rhythm: Normal rate and regular rhythm.     Pulses:          Radial pulses are 2+ on the right side and 2+ on the left side.  Pulmonary:     Effort: Pulmonary effort is normal. No respiratory distress.     Breath sounds: Normal breath sounds.  Abdominal:     General: Abdomen is flat.     Palpations: Abdomen is soft.     Tenderness: There is no abdominal tenderness.   Musculoskeletal:     Right lower leg: No edema.     Left lower leg: No edema.   Skin:    General: Skin is warm and dry.     Capillary Refill: Capillary refill takes less than 2 seconds.   Neurological:     Mental Status: He is alert and oriented to person, place, and time. Mental status is at baseline.   Psychiatric:        Mood and Affect: Mood normal.        Behavior: Behavior normal.     ED Results and Treatments Labs (all labs ordered are listed, but only abnormal results are displayed) Labs Reviewed  COMPREHENSIVE METABOLIC PANEL WITH GFR - Abnormal; Notable for the following components:      Result Value   Glucose, Bld 101 (*)    All other components within normal limits  CBC WITH DIFFERENTIAL/PLATELET  TROPONIN I (HIGH SENSITIVITY)  Radiology DG Chest 2 View Result Date: 01/25/2024 CLINICAL DATA:  Chest pain EXAM: CHEST - 2 VIEW COMPARISON:  None Available. FINDINGS: The heart size and mediastinal contours are within normal limits. Both  lungs are clear. The visualized skeletal structures are unremarkable. IMPRESSION: No active cardiopulmonary disease. Electronically Signed   By: Esmeralda Hedge M.D.   On: 01/25/2024 16:10    Pertinent labs & imaging results that were available during my care of the patient were reviewed by me and considered in my medical decision making (see MDM for details).  Medications Ordered in ED Medications - No data to display                                                                                                                                   Procedures Procedures  (including critical care time)  Medical Decision Making / ED Course   MDM:  28 year old presenting with episode of chest pain.  Patient well-appearing, physical examination reassuring.  EKG is reassuring, normal sinus rhythm, no concerning other changes like T wave abnormality or signs of proarrhythmic condition.  Patient does report increased stress recently, anxiety during episode, could be anxiety related, could differential also includes GERD, did have some belching associated. Recommended trial of pepcid . Doubt any dangerous cause of chest pain such as ACS, troponin negative, EKG reassuring, patient very young.  Considered PE , patient PERC negative.  Considered pneumothorax or pneumonia, currently not having any symptoms, x-ray was reassuring.  Patient also was curious about whether it could be related to thyroid , not having any recent hyper or hypothyroid symptoms, neck pain or swelling, so low concern for this, recommended follow-up with his primary doctor.  Will discharge patient to home. All questions answered. Patient comfortable with plan of discharge. Return precautions discussed with patient and specified on the after visit summary.       Additional history obtained: -Additional history obtained from spouse -External records from outside source obtained and reviewed including: Chart review including previous  notes, labs, imaging, consultation notes including prior notes    Lab Tests: -I ordered, reviewed, and interpreted labs.   The pertinent results include:   Labs Reviewed  COMPREHENSIVE METABOLIC PANEL WITH GFR - Abnormal; Notable for the following components:      Result Value   Glucose, Bld 101 (*)    All other components within normal limits  CBC WITH DIFFERENTIAL/PLATELET  TROPONIN I (HIGH SENSITIVITY)    Notable for normal troponin  EKG   EKG Interpretation Date/Time:  Tuesday January 25 2024 17:09:54 EDT Ventricular Rate:  65 PR Interval:  129 QRS Duration:  111 QT Interval:  378 QTC Calculation: 393 R Axis:   71  Text Interpretation: Sinus rhythm Confirmed by Hiawatha Lout ([id number]) on 01/25/2024 5:33:55 PM         Imaging Studies ordered: I ordered imaging studies including CXR On my interpretation imaging demonstrates no acute process  I independently visualized and interpreted imaging. I agree with the radiologist interpretation   Medicines ordered and prescription drug management: No orders of the defined types were placed in this encounter.   -I have reviewed the patients home medicines and have made adjustments as needed   Social Determinants of Health:  Diagnosis or treatment significantly limited by social determinants of health: obesity    Co morbidities that complicate the patient evaluation  Past Medical History:  Diagnosis Date   ADHD (attention deficit hyperactivity disorder)    GERD (gastroesophageal reflux disease)    Goiter    Movement disorder    Physical growth delay    Poor appetite    Puberty delay    Stomach pain       Dispostion: Disposition decision including need for hospitalization was considered, and patient discharged from emergency department.    Final Clinical Impression(s) / ED Diagnoses Final diagnoses:  Atypical chest pain     This chart was dictated using voice recognition software.  Despite best efforts  to proofread,  errors can occur which can change the documentation meaning.    Mordecai Applebaum, MD 01/25/24 [id number]

## 2024-01-25 NOTE — Discharge Instructions (Signed)
 We evaluated you for your chest pain.  Your testing in the emergency department including your EKG and cardiac enzyme testing was reassuring.  We do not think your symptoms were due to a dangerous cause of chest pain like a heart attack..  They could be due to reflux.  Since omeprazole  has not worked for you in the past, you can also try Pepcid , which you can buy over-the-counter.  Please follow-up closely with your primary doctor, and return for any new or worsening symptoms.

## 2024-01-25 NOTE — ED Triage Notes (Signed)
 Pt arrived via POV c/o central chest pain X 1 week and report she feels it may be his acid reflux. Pt reports calling his PCP office and being advised to go to the ER for evaluation. Pt does endorse family Hx of thyroid  cancer as well and endorses feeling lightheaded, dizzy and SOB.

## 2024-03-18 ENCOUNTER — Other Ambulatory Visit: Payer: Self-pay

## 2024-03-18 ENCOUNTER — Encounter (HOSPITAL_COMMUNITY): Payer: Self-pay | Admitting: Emergency Medicine

## 2024-03-18 ENCOUNTER — Emergency Department (HOSPITAL_COMMUNITY)
Admission: EM | Admit: 2024-03-18 | Discharge: 2024-03-18 | Disposition: A | Discharge status: Home / Self Care | Attending: Emergency Medicine | Admitting: Emergency Medicine

## 2024-03-18 DIAGNOSIS — R21 Rash and other nonspecific skin eruption: Secondary | ICD-10-CM | POA: Diagnosis not present

## 2024-03-18 MED ORDER — DOXYCYCLINE HYCLATE 100 MG PO CAPS: 100.0000 mg | ORAL_CAPSULE | Freq: Two times a day (BID) | ORAL | 0 refills | Status: DC

## 2024-03-18 NOTE — Discharge Instructions (Signed)
 Keep area on your torso clean and dry.  As we discussed, this may develop into shingles which will cause a rash with vesicles that may drain. For the rash on your leg, this will likely improve with the doxycycline 

## 2024-03-18 NOTE — ED Triage Notes (Signed)
 Pt states he was bitten by presumed insects (either a spider or ant or both). Pt states was bitten on lateral aspect of R leg and states pain shoots up to hip and down to foot. Pt also reports bite to R upper torso area and c/o pain/soreness.

## 2024-03-18 NOTE — ED Provider Notes (Signed)
  [place] EMERGENCY DEPARTMENT AT [facility] Provider Note   CSN: [id number] Arrival date & time: 03/18/24  0045     Patient presents with: Insect Bites   Andrew Lynch is a 28 y.o. male.   The history is provided by the patient.  Patient presents because he thinks he was bit by insects. He reports he was working on the cars when he started having a rash in his torso and his right leg.  The rash on his leg hurts causing pain to move up and down his leg No fevers or vomiting.  No oral swelling.  No shortness of breath     Prior to Admission medications   Medication Sig Start Date End Date Taking? Authorizing Provider  doxycycline  (VIBRAMYCIN ) 100 MG capsule Take 1 capsule (100 mg total) by mouth 2 (two) times daily. One po bid x 7 days 03/18/24  Yes Midge Golas, MD  ibuprofen  (ADVIL ) 600 MG tablet Take 1 tablet (600 mg total) by mouth every 8 (eight) hours as needed. 12/14/23   Bevely Doffing, FNP    Allergies: Ivp dye [iodinated contrast media], Poison ivy extract, Poison oak extract, and Poison sumac extract    Review of Systems  Updated Vital Signs BP (!) 144/91 (BP Location: Right Arm)   Pulse 68   Temp 97.9 F (36.6 C) (Oral)   Resp 18   Ht 1.575 m (5' 2)   Wt 82 kg   SpO2 99%   BMI 33.06 kg/m   Physical Exam CONSTITUTIONAL: Well developed/well nourished HEAD: Normocephalic/atraumatic EYES: EOMI/PERRL ENMT: Mucous membranes moist, no angioedema No signs of respiratory distress NEURO: Pt is awake/alert/appropriate, moves all extremitiesx4.  No facial droop.  Full range of motion of all 4 extremities though difficult SKIN: warm, color normal Area of erythema noted to the right lower extremity, but no abscess, no fluctuance, no crepitus Rash noted to right torso, see photo below PSYCH: no abnormalities of mood noted, alert and oriented to situation   (all labs ordered are listed, but only abnormal results are displayed) Labs Reviewed - No  data to display  EKG: None  Radiology: No results found.   Procedures   Medications Ordered in the ED - No data to display                                  Medical Decision Making Risk Prescription drug management.   For The lesion on his leg, this could be early cellulitis/abscess, will start oral antibiotics   Unclear cause of the lesions on his torso.  The initial photos he has on his phone from 2 PM appear to be more consistent with urticaria However currently this could be early herpes zoster. Advised to keep the area clean and dry, monitor his symptoms but will defer any treatment this time.  No other rash noted.  No angioedema, no signs of allergic reaction Patient safe for discharge     Final diagnoses:  Rash    ED Discharge Orders          Ordered    doxycycline  (VIBRAMYCIN ) 100 MG capsule  2 times daily        03/18/24 9795               Midge Golas, MD 03/18/24 0211

## 2024-05-03 ENCOUNTER — Telehealth: Payer: Self-pay | Admitting: Family Medicine

## 2024-05-03 NOTE — Telephone Encounter (Signed)
 Please advise.  Copied from CRM [id number]. Topic: General - Other >> May 02, 2024  5:07 PM Nathanel BROCKS wrote: Reason for CRM: pt is requesting to be transferred to Dr Zawalo for future appts. Please advise pt in regards to this matter.

## 2024-05-03 NOTE — Telephone Encounter (Signed)
 Called patient and left voicemail to call office to schedule an follow up appointment with [facility] next available opening.

## 2024-05-03 NOTE — Telephone Encounter (Signed)
 That's fine

## 2024-06-11 ENCOUNTER — Encounter (HOSPITAL_COMMUNITY): Admission: EM | Disposition: A | Payer: Self-pay | Source: Home / Self Care | Attending: Hospitalist

## 2024-06-11 ENCOUNTER — Observation Stay (HOSPITAL_COMMUNITY)
Admission: EM | Admit: 2024-06-11 | Discharge: 2024-06-12 | Disposition: A | Discharge status: Home / Self Care | Attending: Surgery | Admitting: Surgery

## 2024-06-11 ENCOUNTER — Inpatient Hospital Stay (HOSPITAL_COMMUNITY)

## 2024-06-11 ENCOUNTER — Encounter (HOSPITAL_COMMUNITY): Payer: Self-pay | Admitting: Emergency Medicine

## 2024-06-11 ENCOUNTER — Emergency Department (HOSPITAL_COMMUNITY)

## 2024-06-11 ENCOUNTER — Other Ambulatory Visit: Payer: Self-pay

## 2024-06-11 DIAGNOSIS — R1031 Right lower quadrant pain: Secondary | ICD-10-CM | POA: Diagnosis present

## 2024-06-11 DIAGNOSIS — Z87891 Personal history of nicotine dependence: Secondary | ICD-10-CM | POA: Diagnosis not present

## 2024-06-11 DIAGNOSIS — E876 Hypokalemia: Secondary | ICD-10-CM | POA: Insufficient documentation

## 2024-06-11 DIAGNOSIS — K353 Acute appendicitis with localized peritonitis, without perforation or gangrene: Principal | ICD-10-CM | POA: Insufficient documentation

## 2024-06-11 DIAGNOSIS — K219 Gastro-esophageal reflux disease without esophagitis: Secondary | ICD-10-CM | POA: Diagnosis not present

## 2024-06-11 DIAGNOSIS — K3580 Unspecified acute appendicitis: Secondary | ICD-10-CM | POA: Diagnosis present

## 2024-06-11 HISTORY — PX: XI ROBOTIC LAPAROSCOPIC ASSISTED APPENDECTOMY: SHX6877

## 2024-06-11 LAB — CBC WITH DIFFERENTIAL/PLATELET
Abs Immature Granulocytes: 0.03 K/uL (ref 0.00–0.07)
Basophils Absolute: 0.1 K/uL (ref 0.0–0.1)
Basophils Relative: 1 %
Eosinophils Absolute: 0.4 K/uL (ref 0.0–0.5)
Eosinophils Relative: 3 %
HCT: 44.4 % (ref 39.0–52.0)
Hemoglobin: 15.8 g/dL (ref 13.0–17.0)
Immature Granulocytes: 0 %
Lymphocytes Relative: 18 %
Lymphs Abs: 2.3 K/uL (ref 0.7–4.0)
MCH: 29.9 pg (ref 26.0–34.0)
MCHC: 35.6 g/dL (ref 30.0–36.0)
MCV: 84.1 fL (ref 80.0–100.0)
Monocytes Absolute: 1.1 K/uL — ABNORMAL HIGH (ref 0.1–1.0)
Monocytes Relative: 9 %
Neutro Abs: 8.7 K/uL — ABNORMAL HIGH (ref 1.7–7.7)
Neutrophils Relative %: 69 %
Platelets: 353 K/uL (ref 150–400)
RBC: 5.28 MIL/uL (ref 4.22–5.81)
RDW: 12.1 % (ref 11.5–15.5)
WBC: 12.7 K/uL — ABNORMAL HIGH (ref 4.0–10.5)
nRBC: 0 % (ref 0.0–0.2)

## 2024-06-11 LAB — BASIC METABOLIC PANEL WITH GFR
Anion gap: 11 (ref 5–15)
BUN: 9 mg/dL (ref 6–20)
CO2: 27 mmol/L (ref 22–32)
Calcium: 9.1 mg/dL (ref 8.9–10.3)
Chloride: 102 mmol/L (ref 98–111)
Creatinine, Ser: 0.96 mg/dL (ref 0.61–1.24)
GFR, Estimated: >60 mL/min (ref >60)
Glucose, Bld: 124 mg/dL — ABNORMAL HIGH (ref 70–99)
Potassium: 3.3 mmol/L — ABNORMAL LOW (ref 3.5–5.1)
Sodium: 140 mmol/L (ref 135–145)

## 2024-06-11 LAB — MAGNESIUM: Magnesium: 2 mg/dL (ref 1.7–2.4)

## 2024-06-11 LAB — PHOSPHORUS: Phosphorus: 4 mg/dL (ref 2.5–4.6)

## 2024-06-11 LAB — HIV ANTIBODY (ROUTINE TESTING W REFLEX): HIV Screen 4th Generation wRfx: NONREACTIVE

## 2024-06-11 SURGERY — APPENDECTOMY, ROBOT-ASSISTED, LAPAROSCOPIC
Anesthesia: General | Site: Abdomen

## 2024-06-11 MED ORDER — SENNOSIDES-DOCUSATE SODIUM 8.6-50 MG PO TABS
1.0000 | ORAL_TABLET | Freq: Two times a day (BID) | ORAL | Status: DC
  Administered 2024-06-11 – 2024-06-12 (×3): 1 via ORAL
  Filled 2024-06-11 (×6): qty 1

## 2024-06-11 MED ORDER — MIDAZOLAM HCL (PF) 2 MG/2ML IJ SOLN
INTRAMUSCULAR | Status: DC | PRN
  Administered 2024-06-11: 2 mg via INTRAVENOUS

## 2024-06-11 MED ORDER — SODIUM CHLORIDE 0.9 % IV BOLUS
1000.0000 mL | Freq: Once | INTRAVENOUS | Status: AC
  Administered 2024-06-11: 1000 mL via INTRAVENOUS

## 2024-06-11 MED ORDER — OXYCODONE HCL 5 MG PO TABS: 5.0000 mg | ORAL_TABLET | Freq: Four times a day (QID) | ORAL | 0 refills | Status: DC | PRN

## 2024-06-11 MED ORDER — FENTANYL CITRATE (PF) 100 MCG/2ML IJ SOLN
INTRAMUSCULAR | Status: DC | PRN
  Administered 2024-06-11 (×2): 100 ug via INTRAVENOUS

## 2024-06-11 MED ORDER — OXYCODONE HCL 5 MG PO TABS: 5.0000 mg | ORAL_TABLET | Freq: Once | ORAL | Status: DC | PRN

## 2024-06-11 MED ORDER — PROPOFOL 10 MG/ML IV BOLUS
INTRAVENOUS | Status: AC
Start: 2024-06-11 — End: 2024-06-11
  Filled 2024-06-11: qty 20

## 2024-06-11 MED ORDER — MORPHINE SULFATE (PF) 4 MG/ML IV SOLN
4.0000 mg | Freq: Once | INTRAVENOUS | Status: AC
  Administered 2024-06-11: 4 mg via INTRAVENOUS
  Filled 2024-06-11: qty 1

## 2024-06-11 MED ORDER — METRONIDAZOLE 500 MG/100ML IV SOLN: 500.0000 mg | Freq: Two times a day (BID) | INTRAVENOUS | Status: DC

## 2024-06-11 MED ORDER — ACETAMINOPHEN 650 MG RE SUPP: 650.0000 mg | Freq: Four times a day (QID) | RECTAL | Status: DC | PRN

## 2024-06-11 MED ORDER — ROCURONIUM BROMIDE 100 MG/10ML IV SOLN
INTRAVENOUS | Status: DC | PRN
  Administered 2024-06-11: 50 mg via INTRAVENOUS

## 2024-06-11 MED ORDER — BUPIVACAINE HCL (PF) 0.5 % IJ SOLN
INTRAMUSCULAR | Status: DC | PRN
  Administered 2024-06-11: 30 mL

## 2024-06-11 MED ORDER — OXYCODONE HCL 5 MG/5ML PO SOLN: 5.0000 mg | Freq: Once | ORAL | Status: DC | PRN

## 2024-06-11 MED ORDER — POTASSIUM CHLORIDE 10 MEQ/100ML IV SOLN
10.0000 meq | INTRAVENOUS | Status: AC
  Administered 2024-06-11 (×2): 10 meq via INTRAVENOUS
  Filled 2024-06-11 (×2): qty 100

## 2024-06-11 MED ORDER — LACTATED RINGERS IV BOLUS
1000.0000 mL | Freq: Once | INTRAVENOUS | Status: AC
  Administered 2024-06-11: 1000 mL via INTRAVENOUS

## 2024-06-11 MED ORDER — ONDANSETRON HCL 4 MG/2ML IJ SOLN
4.0000 mg | Freq: Four times a day (QID) | INTRAMUSCULAR | Status: DC | PRN
  Administered 2024-06-11 – 2024-06-12 (×4): 4 mg via INTRAVENOUS
  Filled 2024-06-11 (×4): qty 2

## 2024-06-11 MED ORDER — FENTANYL CITRATE (PF) 50 MCG/ML IJ SOSY
PREFILLED_SYRINGE | INTRAMUSCULAR | Status: AC
  Filled 2024-06-11: qty 1

## 2024-06-11 MED ORDER — SUGAMMADEX SODIUM 200 MG/2ML IV SOLN
INTRAVENOUS | Status: DC | PRN
Start: 2024-06-11 — End: 2024-06-11
  Administered 2024-06-11: 100 mg via INTRAVENOUS

## 2024-06-11 MED ORDER — ACETAMINOPHEN 500 MG PO TABS
1000.0000 mg | ORAL_TABLET | Freq: Four times a day (QID) | ORAL | Status: DC
  Administered 2024-06-11 – 2024-06-12 (×4): 1000 mg via ORAL
  Filled 2024-06-11 (×4): qty 2

## 2024-06-11 MED ORDER — SODIUM CHLORIDE 0.9 % IV SOLN: 2.0000 g | INTRAVENOUS | Status: DC

## 2024-06-11 MED ORDER — FENTANYL CITRATE (PF) 50 MCG/ML IJ SOSY
25.0000 ug | PREFILLED_SYRINGE | INTRAMUSCULAR | Status: DC | PRN
  Administered 2024-06-11: 50 ug via INTRAVENOUS

## 2024-06-11 MED ORDER — SUCCINYLCHOLINE 20MG/ML (10ML) SYRINGE FOR MEDFUSION PUMP - OPTIME
INTRAMUSCULAR | Status: DC | PRN
Start: 2024-06-11 — End: 2024-06-11
  Administered 2024-06-11: 120 mg via INTRAVENOUS

## 2024-06-11 MED ORDER — STERILE WATER FOR IRRIGATION IR SOLN
Status: DC | PRN
Start: 2024-06-11 — End: 2024-06-11
  Administered 2024-06-11: 500 mL

## 2024-06-11 MED ORDER — LACTATED RINGERS IV SOLN: INTRAVENOUS | Status: DC

## 2024-06-11 MED ORDER — PROPOFOL 10 MG/ML IV BOLUS
INTRAVENOUS | Status: DC | PRN
Start: 2024-06-11 — End: 2024-06-11
  Administered 2024-06-11: 180 mg via INTRAVENOUS

## 2024-06-11 MED ORDER — METRONIDAZOLE 500 MG/100ML IV SOLN
500.0000 mg | Freq: Once | INTRAVENOUS | Status: AC
  Administered 2024-06-11: 500 mg via INTRAVENOUS
  Filled 2024-06-11: qty 100

## 2024-06-11 MED ORDER — ACETAMINOPHEN 325 MG PO TABS
650.0000 mg | ORAL_TABLET | Freq: Four times a day (QID) | ORAL | Status: DC | PRN
Start: 2024-06-11 — End: 2024-06-11

## 2024-06-11 MED ORDER — MORPHINE SULFATE (PF) 2 MG/ML IV SOLN
2.0000 mg | INTRAVENOUS | Status: DC | PRN
Start: 2024-06-11 — End: 2024-06-12

## 2024-06-11 MED ORDER — SODIUM CHLORIDE 0.9 % IV SOLN
2.0000 g | Freq: Once | INTRAVENOUS | Status: AC
  Administered 2024-06-11: 2 g via INTRAVENOUS
  Filled 2024-06-11: qty 20

## 2024-06-11 MED ORDER — DOCUSATE SODIUM 100 MG PO CAPS: 100.0000 mg | ORAL_CAPSULE | Freq: Two times a day (BID) | ORAL | 2 refills | Status: AC

## 2024-06-11 MED ORDER — OXYCODONE HCL 5 MG PO TABS
5.0000 mg | ORAL_TABLET | ORAL | Status: DC | PRN
  Administered 2024-06-11 – 2024-06-12 (×4): 5 mg via ORAL
  Filled 2024-06-11 (×4): qty 1

## 2024-06-11 MED ORDER — FENTANYL CITRATE (PF) 100 MCG/2ML IJ SOLN
INTRAMUSCULAR | Status: AC
  Filled 2024-06-11: qty 2

## 2024-06-11 MED ORDER — KETOROLAC TROMETHAMINE 30 MG/ML IJ SOLN
INTRAMUSCULAR | Status: DC | PRN
Start: 2024-06-11 — End: 2024-06-11
  Administered 2024-06-11: 30 mg via INTRAVENOUS

## 2024-06-11 MED ORDER — POTASSIUM CHLORIDE 10 MEQ/100ML IV SOLN
10.0000 meq | Freq: Once | INTRAVENOUS | Status: AC
  Administered 2024-06-11: 10 meq via INTRAVENOUS
  Filled 2024-06-11: qty 100

## 2024-06-11 MED ORDER — SODIUM CHLORIDE 0.9 % IR SOLN
Status: DC | PRN
Start: 2024-06-11 — End: 2024-06-11
  Administered 2024-06-11: 1000 mL

## 2024-06-11 MED ORDER — MIDAZOLAM HCL 2 MG/2ML IJ SOLN
INTRAMUSCULAR | Status: AC
  Filled 2024-06-11: qty 2

## 2024-06-11 MED ORDER — MORPHINE SULFATE (PF) 4 MG/ML IV SOLN
4.0000 mg | INTRAVENOUS | Status: DC | PRN
  Administered 2024-06-11: 4 mg via INTRAVENOUS
  Filled 2024-06-11 (×2): qty 1

## 2024-06-11 MED ORDER — ONDANSETRON HCL 4 MG/2ML IJ SOLN
4.0000 mg | Freq: Once | INTRAMUSCULAR | Status: AC
  Administered 2024-06-11: 4 mg via INTRAVENOUS
  Filled 2024-06-11: qty 2

## 2024-06-11 MED ORDER — SENNOSIDES-DOCUSATE SODIUM 8.6-50 MG PO TABS
1.0000 | ORAL_TABLET | Freq: Two times a day (BID) | ORAL | Status: DC
  Filled 2024-06-11 (×3): qty 1

## 2024-06-11 MED ORDER — ORAL CARE MOUTH RINSE
15.0000 mL | OROMUCOSAL | Status: DC | PRN
Start: 2024-06-11 — End: 2024-06-12

## 2024-06-11 MED ORDER — PANTOPRAZOLE SODIUM 40 MG IV SOLR
40.0000 mg | INTRAVENOUS | Status: DC
  Administered 2024-06-11 – 2024-06-12 (×2): 40 mg via INTRAVENOUS
  Filled 2024-06-11 (×2): qty 10

## 2024-06-11 MED ORDER — ACETAMINOPHEN 500 MG PO TABS: 1000.0000 mg | ORAL_TABLET | Freq: Four times a day (QID) | ORAL | 0 refills | Status: AC

## 2024-06-11 SURGICAL SUPPLY — 41 items
CHLORAPREP W/TINT 26 (MISCELLANEOUS) ×1 IMPLANT
COVER LIGHT HANDLE (MISCELLANEOUS) IMPLANT
COVER MAYO STAND XLG (MISCELLANEOUS) ×1 IMPLANT
DEFOGGER SCOPE WARM SEASHARP (MISCELLANEOUS) ×1 IMPLANT
DERMABOND ADVANCED .7 DNX12 (GAUZE/BANDAGES/DRESSINGS) ×1 IMPLANT
DRAPE ARM DVNC X/XI (DISPOSABLE) ×3 IMPLANT
DRAPE COLUMN DVNC XI (DISPOSABLE) ×1 IMPLANT
DRSG TEGADERM 2-3/8X2-3/4 SM (GAUZE/BANDAGES/DRESSINGS) IMPLANT
FORCEPS BPLR R/ABLATION 8 DVNC (INSTRUMENTS) ×1 IMPLANT
GAUZE SPONGE 2X2 STRL 8-PLY (GAUZE/BANDAGES/DRESSINGS) IMPLANT
GLOVE BIOGEL PI IND STRL 6.5 (GLOVE) ×2 IMPLANT
GLOVE BIOGEL PI IND STRL 7.0 (GLOVE) ×3 IMPLANT
GLOVE SURG SS PI 6.5 STRL IVOR (GLOVE) ×2 IMPLANT
GOWN STRL REUS W/TWL LRG LVL3 (GOWN DISPOSABLE) ×3 IMPLANT
GRASPER SUT TROCAR 14GX15 (MISCELLANEOUS) ×1 IMPLANT
IRRIGATOR SUCT 8 DISP DVNC XI (IRRIGATION / IRRIGATOR) IMPLANT
IV 0.9% NACL 1000 ML (IV SOLUTION) IMPLANT
KIT PINK PAD W/HEAD ARM REST (MISCELLANEOUS) ×1 IMPLANT
KIT TURNOVER KIT A (KITS) ×1 IMPLANT
MANIFOLD NEPTUNE II (INSTRUMENTS) ×1 IMPLANT
NDL HYPO 21X1.5 SAFETY (NEEDLE) ×1 IMPLANT
NDL INSUFFLATION 14GA 120MM (NEEDLE) ×1 IMPLANT
NEEDLE HYPO 21X1.5 SAFETY (NEEDLE) ×1 IMPLANT
NEEDLE INSUFFLATION 14GA 120MM (NEEDLE) ×1 IMPLANT
OBTURATOR OPTICALSTD 8 DVNC (TROCAR) ×1 IMPLANT
PACK LAP CHOLE LZT030E (CUSTOM PROCEDURE TRAY) ×1 IMPLANT
PENCIL HANDSWITCHING (ELECTRODE) ×1 IMPLANT
RELOAD STAPLE 30 2.5 WHT DVNC (STAPLE) ×1 IMPLANT
SEAL UNIV 5-12 XI (MISCELLANEOUS) ×4 IMPLANT
SEALER VESSEL EXT DVNC XI (MISCELLANEOUS) ×1 IMPLANT
SET BASIN LINEN APH (SET/KITS/TRAYS/PACK) ×1 IMPLANT
SET TUBE SMOKE EVAC HIGH FLOW (TUBING) ×1 IMPLANT
STAPLER 30 CRVD 8 SUREFORM (STAPLE) ×1 IMPLANT
STAPLER VISISTAT (STAPLE) IMPLANT
SUT MNCRL AB 4-0 PS2 18 (SUTURE) ×1 IMPLANT
SUT VICRYL 0 UR6 27IN ABS (SUTURE) ×1 IMPLANT
SYR 30ML LL (SYRINGE) ×1 IMPLANT
SYSTEM RETRIEVL 5MM INZII UNIV (BASKET) ×1 IMPLANT
TAPE TRANSPORE STRL 2 31045 (GAUZE/BANDAGES/DRESSINGS) ×1 IMPLANT
TRAY FOLEY W/BAG SLVR 16FR ST (SET/KITS/TRAYS/PACK) ×1 IMPLANT
WATER STERILE IRR 500ML POUR (IV SOLUTION) ×1 IMPLANT

## 2024-06-11 NOTE — Progress Notes (Signed)
 Pt down to pre-op holding via bed by OR staff for planned surgery.

## 2024-06-11 NOTE — Anesthesia Procedure Notes (Signed)
 Procedure Name: Intubation Date/Time: 06/11/2024 11:15 AM  Performed by: Herschell Hollering, MDPre-anesthesia Checklist: Patient identified, Emergency Drugs available, Suction available and Patient being monitored Patient Re-evaluated:Patient Re-evaluated prior to induction Oxygen Delivery Method: Circle system utilized Preoxygenation: Pre-oxygenation with 100% oxygen Induction Type: IV induction Ventilation: Mask ventilation without difficulty Laryngoscope Size: Mac and 3 Grade View: Grade II Tube type: Oral Number of attempts: 1 Airway Equipment and Method: Stylet and Oral airway Placement Confirmation: ETT inserted through vocal cords under direct vision, positive ETCO2 and breath sounds checked- equal and bilateral Tube secured with: Tape Dental Injury: Teeth and Oropharynx as per pre-operative assessment

## 2024-06-11 NOTE — Anesthesia Postprocedure Evaluation (Signed)
 Anesthesia Post Note  Patient: Andrew Lynch  Procedure(s) Performed: APPENDECTOMY, ROBOT-ASSISTED, LAPAROSCOPIC (Abdomen)  Patient location during evaluation: PACU Anesthesia Type: General Level of consciousness: awake and alert Pain management: pain level controlled Vital Signs Assessment: post-procedure vital signs reviewed and stable Respiratory status: spontaneous breathing, nonlabored ventilation, respiratory function stable and patient connected to nasal cannula oxygen Cardiovascular status: blood pressure returned to baseline and stable Postop Assessment: no apparent nausea or vomiting Anesthetic complications: no   No notable events documented.   Last Vitals:  Vitals:   06/11/24 0326 06/11/24 1000  BP: 138/86 114/73  Pulse: 95 90  Resp: 18 18  Temp: 36.6 C 36.8 C  SpO2: 96% 96%    Last Pain:  Vitals:   06/11/24 1026  TempSrc:   PainSc: 2                  Andrea Limes

## 2024-06-11 NOTE — ED Provider Notes (Signed)
 [place] EMERGENCY DEPARTMENT AT [facility] Provider Note   CSN: [id number] Arrival date & time: 06/11/24  9981     Patient presents with: Abdominal Pain   Andrew Lynch is a 28 y.o. male.   Presents to the emergency department for evaluation of right-sided abdominal pain.  Patient reports he first felt pain in the right lower abdomen this morning.  It has been present throughout the day.  Patient reports that pain worsens with movement and walking.  He has had nausea but no vomiting.  No diarrhea.       Prior to Admission medications   Medication Sig Start Date End Date Taking? Authorizing Provider  doxycycline  (VIBRAMYCIN ) 100 MG capsule Take 1 capsule (100 mg total) by mouth 2 (two) times daily. One po bid x 7 days 03/18/24   Midge Golas, MD  ibuprofen  (ADVIL ) 600 MG tablet Take 1 tablet (600 mg total) by mouth every 8 (eight) hours as needed. 12/14/23   Bevely Doffing, FNP    Allergies: Ivp dye [iodinated contrast media], Poison ivy extract, Poison oak extract, and Poison sumac extract    Review of Systems  Updated Vital Signs BP (!) 134/92   Pulse 95   Temp 98.3 F (36.8 C) (Oral)   Resp 16   SpO2 97%   Physical Exam Vitals and nursing note reviewed.  Constitutional:      General: He is not in acute distress.    Appearance: He is well-developed.  HENT:     Head: Normocephalic and atraumatic.     Mouth/Throat:     Mouth: Mucous membranes are moist.  Eyes:     General: Vision grossly intact. Gaze aligned appropriately.     Extraocular Movements: Extraocular movements intact.     Conjunctiva/sclera: Conjunctivae normal.  Cardiovascular:     Rate and Rhythm: Normal rate and regular rhythm.     Pulses: Normal pulses.     Heart sounds: Normal heart sounds, S1 normal and S2 normal. No murmur heard.    No friction rub. No gallop.  Pulmonary:     Effort: Pulmonary effort is normal. No respiratory distress.     Breath sounds: Normal breath  sounds.  Abdominal:     Palpations: Abdomen is soft.     Tenderness: There is abdominal tenderness in the right lower quadrant. There is guarding. There is no rebound. Positive signs include psoas sign.     Hernia: No hernia is present.  Musculoskeletal:        General: No swelling.     Cervical back: Full passive range of motion without pain, normal range of motion and neck supple. No pain with movement, spinous process tenderness or muscular tenderness. Normal range of motion.     Right lower leg: No edema.     Left lower leg: No edema.  Skin:    General: Skin is warm and dry.     Capillary Refill: Capillary refill takes less than 2 seconds.     Findings: No ecchymosis, erythema, lesion or wound.  Neurological:     Mental Status: He is alert and oriented to person, place, and time.     GCS: GCS eye subscore is 4. GCS verbal subscore is 5. GCS motor subscore is 6.     Cranial Nerves: Cranial nerves 2-12 are intact.     Sensory: Sensation is intact.     Motor: Motor function is intact. No weakness or abnormal muscle tone.     Coordination: Coordination  is intact.  Psychiatric:        Mood and Affect: Mood normal.        Speech: Speech normal.        Behavior: Behavior normal.     (all labs ordered are listed, but only abnormal results are displayed) Labs Reviewed  CBC WITH DIFFERENTIAL/PLATELET - Abnormal; Notable for the following components:      Result Value   WBC 12.7 (*)    Neutro Abs 8.7 (*)    Monocytes Absolute 1.1 (*)    All other components within normal limits  BASIC METABOLIC PANEL WITH GFR - Abnormal; Notable for the following components:   Potassium 3.3 (*)    Glucose, Bld 124 (*)    All other components within normal limits    EKG: None  Radiology: CT ABDOMEN PELVIS WO CONTRAST Result Date: 06/11/2024 EXAM: CT ABDOMEN AND PELVIS WITHOUT CONTRAST 06/11/2024 01:11:12 AM TECHNIQUE: CT of the abdomen and pelvis was performed without the administration of  intravenous contrast. Multiplanar reformatted images are provided for review. Automated exposure control, iterative reconstruction, and/or weight-based adjustment of the mA/kV was utilized to reduce the radiation dose to as low as reasonably achievable. COMPARISON: None available. CLINICAL HISTORY: RLQ abdominal pain. FINDINGS: LOWER CHEST: No acute abnormality. LIVER: The liver is unremarkable. GALLBLADDER AND BILE DUCTS: Gallbladder is unremarkable. No biliary ductal dilatation. SPLEEN: No acute abnormality. PANCREAS: No acute abnormality. ADRENAL GLANDS: No acute abnormality. KIDNEYS, URETERS AND BLADDER: No stones in the kidneys or ureters. No hydronephrosis. No perinephric or periureteral stranding. Urinary bladder is unremarkable. GI AND BOWEL: Stomach demonstrates no acute abnormality. There is no bowel obstruction. Small appendicolith noted within the mid appendix. Distal to the appendicolith, the appendix is dilated measuring 12 mm with surrounding inflammation. Findings compatible with acute appendicitis. PERITONEUM AND RETROPERITONEUM: No ascites. No free air. VASCULATURE: Aorta is normal in caliber. LYMPH NODES: No lymphadenopathy. REPRODUCTIVE ORGANS: No acute abnormality. BONES AND SOFT TISSUES: No acute osseous abnormality. No focal soft tissue abnormality. IMPRESSION: 1. Acute appendicitis with a small appendicolith in the mid appendix. 2. These results were called to Dr. Haze at the time of interpretation. Electronically signed by: Franky Crease MD 06/11/2024 01:24 AM EDT RP Workstation: [id number]     Procedures   Medications Ordered in the ED  lactated ringers bolus 1,000 mL (has no administration in time range)  cefTRIAXone (ROCEPHIN) 2 g in sodium chloride 0.9 % 100 mL IVPB (2 g Intravenous New Bag/Given 06/11/24 0134)  metroNIDAZOLE (FLAGYL) IVPB 500 mg (has no administration in time range)  sodium chloride 0.9 % bolus 1,000 mL (1,000 mLs Intravenous New Bag/Given 06/11/24 0058)   morphine (PF) 4 MG/ML injection 4 mg (4 mg Intravenous Given 06/11/24 0131)  ondansetron  (ZOFRAN ) injection 4 mg (4 mg Intravenous Given 06/11/24 0131)                                    Medical Decision Making Amount and/or Complexity of Data Reviewed Labs: ordered. Decision-making details documented in ED Course. Radiology: ordered and independent interpretation performed. Decision-making details documented in ED Course.  Risk Prescription drug management.   Differential Diagnosis considered includes, but not limited to: Appendicitis; colitis; diverticulitis; bowel obstruction; hernia; cystitis; nephrolithiasis; pyelonephritis.   Otherwise healthy 28 year old male presents with 1 day of right lower quadrant abdominal pain.  Patient with tenderness in the right lower quadrant, positive psoas sign.  Patient with  slight leukocytosis.  CT scan shows uncomplicated appendicitis.  Analgesia, hydration, antibiotics administered.  Dr. Gerarda consulted, will see patient in consultation, asked for hospitalist admission.     Final diagnoses:  Acute appendicitis with localized peritonitis, without perforation, abscess, or gangrene    ED Discharge Orders     None          Margalit Leece, Lonni PARAS, MD 06/11/24 0134

## 2024-06-11 NOTE — Plan of Care (Signed)
  Problem: Education: Goal: Knowledge of General Education information will improve Description: Including pain rating scale, medication(s)/side effects and non-pharmacologic comfort measures Outcome: Progressing   Problem: Coping: Goal: Level of anxiety will decrease Outcome: Progressing   Problem: Pain Managment: Goal: General experience of comfort will improve and/or be controlled Outcome: Progressing

## 2024-06-11 NOTE — Progress Notes (Signed)
 Pt c/o burning at IV site with IV KCL rider infusing. No redness at site or increased pain with touch. KCL rate decreased to 75ml/hr for pt comfort. Pt advised to let staff know if continued discomfort, states understanding.

## 2024-06-11 NOTE — H&P (Signed)
 History and Physical    Patient: Andrew Lynch FMW:[id number] DOB: [birth year] DOA: 06/11/2024 DOS: the patient was seen and examined on 06/11/2024 PCP: Terry Wilhelmena Lloyd Hilario, FNP  Patient coming from: Home  Chief Complaint:  Chief Complaint  Patient presents with   Abdominal Pain   HPI: Andrew Lynch is a 28 y.o. male with medical history significant of GERD who presents to the emergency department due to right lower abdominal area pain which started yesterday around 1 PM, pain was described as sharp and rated as 7/10 on pain scale, it worsens with movement and walking and improves with laying still in bed, this was associated with nausea without vomiting.  ED course In the emergency department, BP was 130/92 and other vital signs were within normal range.  Workup in the ED showed normal CBC except for WBC of 12.7.  BMP was normal except for potassium of 3.3 and glucose of 124. CT abdomen and pelvis without contrast showed acute appendicitis with a small appendicolith in the mid appendix. He was treated with IV ceftriaxone and Flagyl, morphine was given, IV hydration was provided.  General surgery (Dr.Pappaliou) was consulted and will see patient in the morning.   Review of Systems: As mentioned in the history of present illness. All other systems reviewed and are negative. Past Medical History:  Diagnosis Date   ADHD (attention deficit hyperactivity disorder)    GERD (gastroesophageal reflux disease)    Goiter    Movement disorder    Physical growth delay    Poor appetite    Puberty delay    Stomach pain    Past Surgical History:  Procedure Laterality Date   SMALL INTESTINE SURGERY     Social History:  reports that he has quit smoking. His smoking use included cigarettes. He has been exposed to tobacco smoke. He has never used smokeless tobacco. He reports that he does not drink alcohol and does not use drugs.  Allergies  Allergen Reactions   Ivp Dye  [Iodinated Contrast Media] Anaphylaxis and Hives   Poison Ivy Extract Rash   Poison Oak Extract Rash   Poison Sumac Extract Rash    Swelling and rash    Family History  Problem Relation Age of Onset   Bipolar disorder Mother    Depression Mother    Anxiety disorder Mother    Insomnia Mother    Thyroid  cancer Father    Hypertension Father    Diabetes Paternal Uncle    Hypertension Maternal Grandmother    Heart attack Maternal Grandmother    Diabetes Maternal Grandfather    Heart disease Maternal Grandfather    Heart attack Maternal Grandfather    Stroke Maternal Grandfather    Epilepsy Maternal Grandfather    Thyroid  disease Neg Hx     Prior to Admission medications   Medication Sig Start Date End Date Taking? Authorizing Provider  doxycycline  (VIBRAMYCIN ) 100 MG capsule Take 1 capsule (100 mg total) by mouth 2 (two) times daily. One po bid x 7 days 03/18/24   Midge Golas, MD  ibuprofen  (ADVIL ) 600 MG tablet Take 1 tablet (600 mg total) by mouth every 8 (eight) hours as needed. 12/14/23   Bevely Doffing, FNP    Physical Exam: Vitals:   06/11/24 0028 06/11/24 0222 06/11/24 0326 06/11/24 0346  BP:  128/76 138/86   Pulse: 95 96 95   Resp:   18   Temp:   97.8 F (36.6 C)   TempSrc:   Oral  SpO2: 97% 97% 96%   Weight:    86.4 kg  Height:    5' 2 (1.575 m)   General: Awake and alert and oriented x3. Not in any acute distress.  HEENT: NCAT.  PERRLA. EOMI. Sclerae anicteric.  Moist mucosal membranes. Neck: Neck supple without lymphadenopathy. No carotid bruits. No masses palpated.  Cardiovascular: Regular rate with normal S1-S2 sounds. No murmurs, rubs or gallops auscultated. No JVD.  Respiratory: Clear breath sounds.  No accessory muscle use. Abdomen: Soft, tender to palpation of RLQ, psoas positive. Active bowel sounds. No masses or hepatosplenomegaly  Skin: No rashes, lesions, or ulcerations.  Dry, warm to touch. Musculoskeletal:  2+ dorsalis pedis and radial pulses.  Good ROM.  No contractures  Psychiatric: Intact judgment and insight.  Mood appropriate to current condition. Neurologic: No focal neurological deficits. Strength is 5/5 x 4.  CN II - XII grossly intact.  Assessment and Plan: Acute appendicitis with localized peritonitis, without perforation, abscess or gangrene Patient was treated with IV ceftriaxone and Flagyl, which she will continue with same at this time Continue IV hydration Continue IV morphine 4 mg q.3h p.r.n. for moderate to severe pain Continue IV Zofran  p.r.n. Continue n.p.o. in anticipation of possible surgical intervention in the morning General Surgery (Dr.Pappayliou) was consulted to follow up with patient in the morning per EDP  Hypokalemia Kcl 3.3, this will be replenished  GERD Continue Protonix    Advance Care Planning: Full code  Consults: General Surgery  Family Communication: Wife at bedside (all questions answered to satisfaction)  Severity of Illness: The appropriate patient status for this patient is INPATIENT. Inpatient status is judged to be reasonable and necessary in order to provide the required intensity of service to ensure the patient's safety. The patient's presenting symptoms, physical exam findings, and initial radiographic and laboratory data in the context of their chronic comorbidities is felt to place them at high risk for further clinical deterioration. Furthermore, it is not anticipated that the patient will be medically stable for discharge from the hospital within 2 midnights of admission.   * I certify that at the point of admission it is my clinical judgment that the patient will require inpatient hospital care spanning beyond 2 midnights from the point of admission due to high intensity of service, high risk for further deterioration and high frequency of surveillance required.*  Author: Nuvia Hileman, DO 06/11/2024 5:55 AM  For on call review [email or web address].

## 2024-06-11 NOTE — Progress Notes (Signed)
 MD Pappayliou up to update pt's significant other post-surgery.

## 2024-06-11 NOTE — Discharge Summary (Signed)
 Physician Discharge Summary  Patient ID: Andrew Lynch MRN: [id number] DOB/AGE: 28/08/1995 28 y.o.  Admit date: 06/11/2024 Discharge date: 06/12/2024  Admission Diagnoses: Acute appendicitis  Discharge Diagnoses:  Principal Problem:   Acute appendicitis   Discharged Condition: stable  Hospital Course: Patient is a 28 year old male who was admitted to the hospital with acute appendicitis.  He underwent a robotic assisted laparoscopic appendectomy on 11/2.  Postoperatively, he was able to tolerate a regular diet without nausea and vomiting, and pain was controlled with oral medications.  His leukocytosis normalized.  He is stable for discharge home at this time.  He has been given prescriptions for oxycodone , Colace, and Tylenol .  He will follow-up with me in 2 weeks for removal of his staples.  Consults: None  Significant Diagnostic Studies: labs: WBC 12.7 and radiology: CT scan: Acute appendicitis  Treatments: IV hydration, antibiotics: Ancef and metronidazole, analgesia: acetaminophen , Morphine, and oxycodone , and surgery: Robotic assisted laparoscopic appendectomy  Discharge Exam: Blood pressure (!) 132/54, pulse 74, temperature 97.6 F (36.4 C), temperature source Oral, resp. rate 18, height 5' 2 (1.575 m), weight 86.4 kg, SpO2 99%. General appearance: alert, cooperative, and no distress GI: Abdomen soft, nondistended, no percussion tenderness, mild incisional tenderness palpation; no rigidity, guarding, rebound tenderness; laparoscopic incision sites C/D/I with skin staples in place  Disposition: Discharge disposition: 01-Home or Self Care       Discharge Instructions     Call MD for:  persistant nausea and vomiting   Complete by: As directed    Call MD for:  redness, tenderness, or signs of infection (pain, swelling, redness, odor or green/yellow discharge around incision site)   Complete by: As directed    Call MD for:  severe uncontrolled pain   Complete by: As  directed    Call MD for:  temperature >100.4   Complete by: As directed    Diet - low sodium heart healthy   Complete by: As directed    Increase activity slowly   Complete by: As directed       Allergies as of 06/12/2024       Reactions   Banana Anaphylaxis, Shortness Of Breath   Ivp Dye [iodinated Contrast Media] Anaphylaxis, Hives   Poison Ivy Extract Rash   Poison Oak Extract Rash   Poison Sumac Extract Rash   Swelling and rash        Medication List     TAKE these medications    acetaminophen  500 MG tablet Commonly known as: TYLENOL  Take 2 tablets (1,000 mg total) by mouth every 6 (six) hours for 7 days. What changed:  how much to take when to take this reasons to take this   docusate sodium 100 MG capsule Commonly known as: Colace Take 1 capsule (100 mg total) by mouth 2 (two) times daily.   famotidine  20 MG tablet Commonly known as: PEPCID  Take 20 mg by mouth daily.   oxyCODONE  5 MG immediate release tablet Commonly known as: Roxicodone  Take 1 tablet (5 mg total) by mouth every 6 (six) hours as needed.   polyethylene glycol 17 g packet Commonly known as: MIRALAX / GLYCOLAX Take 17 g by mouth daily.        Follow-up Information     Lurie Mullane, Dorothyann LABOR, DO. Call.   Specialty: General Surgery Why: Call to schedule a follow up appointment on 11/19 Contact information: [id number] [place] [address] [phone]  Signed: Rashan Rounsaville A Saraya Tirey 06/12/2024, 10:49 AM

## 2024-06-11 NOTE — ED Triage Notes (Signed)
 Pt reports sharp right sided abd pain that began today. Reports nausea but denies vomiting or diarrhea. States the pain is 7/10 and feels like a squeezing sensation.

## 2024-06-11 NOTE — Anesthesia Preprocedure Evaluation (Addendum)
 Anesthesia Evaluation  Patient identified by MRN, date of birth, ID band Patient awake    Reviewed: Allergy & Precautions, H&P , NPO status , Patient's Chart, lab work & pertinent test results  Airway Mallampati: I  TM Distance: >3 FB Neck ROM: Full    Dental no notable dental hx.    Pulmonary neg pulmonary ROS, former smoker   Pulmonary exam normal breath sounds clear to auscultation       Cardiovascular negative cardio ROS Normal cardiovascular exam Rhythm:Regular Rate:Normal     Neuro/Psych  PSYCHIATRIC DISORDERS  Depression    negative neurological ROS  negative psych ROS   GI/Hepatic negative GI ROS, Neg liver ROS,GERD  ,,  Endo/Other  negative endocrine ROS    Renal/GU negative Renal ROS  negative genitourinary   Musculoskeletal negative musculoskeletal ROS (+)    Abdominal   Peds negative pediatric ROS (+)  Hematology negative hematology ROS (+)   Anesthesia Other Findings   Reproductive/Obstetrics negative OB ROS                              Anesthesia Physical Anesthesia Plan  ASA: 2 and emergent  Anesthesia Plan: General   Post-op Pain Management:    Induction: Intravenous  PONV Risk Score and Plan:   Airway Management Planned: Oral ETT  Additional Equipment:   Intra-op Plan:   Post-operative Plan: Extubation in OR  Informed Consent: I have reviewed the patients History and Physical, chart, labs and discussed the procedure including the risks, benefits and alternatives for the proposed anesthesia with the patient or authorized representative who has indicated his/her understanding and acceptance.     Dental advisory given  Plan Discussed with: CRNA  Anesthesia Plan Comments:          Anesthesia Quick Evaluation

## 2024-06-11 NOTE — Progress Notes (Signed)
 [facility]  Spoke with the patient's wife in the patient's room.  I explained that he tolerated the procedure without difficulty.  He has skin staples in place that will be removed at his follow up visit.  He will return to his room on the floor.  If he is able to tolerate his diet without nausea and vomiting and pain is controlled with oral medications, he will be stable for discharge home today vs tomorrow morning.  I have sent in a prescription for narcotic pain medication that they should take as needed for pain.  I also want him taking scheduled Tylenol .  If they take the narcotic pain medication, they should take a stool softener as well.  The patient will follow-up with me in 2 weeks.  All questions were answered to her expressed satisfaction.  Plan: -Return to room on the floor -Regular diet -No longer requires antibiotics -PRN pain control and antiemetics -Heplock IVF -SCDs and incentive spirometry -If patient tolerates a diet without nausea and vomiting and pain is controlled with oral medications, he will be stable for discharge home either today versus tomorrow  Dorothyann Brittle, DO [facility] [address] [place], [place] [phone] (office)

## 2024-06-11 NOTE — Op Note (Signed)
 [facility] Operative Note  Preoperative diagnosis: Acute appendicitis  Postoperative diagnosis: Same  Procedure: Robotic assisted laparoscopic appendectomy.  Anesthesia: General   Surgeon: Dorothyann Brittle, DO  Wound Classification: Contaminated  Specimen: Appendix  Complications: None  Estimated Blood Loss: 10 cc  Indications: Patient is a 28 y.o. male  presented with a 1 day history of abdominal pain.  CT of the abdomen and pelvis demonstrated concern for appendicitis. The risk of surgery were explained to the patient including but not limited to bleeding, infection, finding a rupture, injury to other organs, needing to do an open procedure, and development of intra-abdominal abscess.  Patient is agreeable to surgery and both written and verbal consent were obtained.  FIndings: Acute appendicitis Upon entering the abdomen (organ space), I encountered a phlegmon involving the appendix. Staple line along the base of the appendix was noted to be intact Hemostasis was noted at the completion of the case  Description of procedure: The patient was placed on the operating table in the supine position, left arm tucked. General anesthesia was induced. A time-out was completed verifying correct patient, procedure, site, positioning, and implant(s) and/or special equipment prior to beginning this procedure. The abdomen was prepped and draped in the usual sterile fashion.   At Palmer's point, an incision was made and Veress needle was inserted.  After confirming intraabdominal location with positive saline drop test and low insufflation pressures, gas insufflation was initiated until the abdominal pressure was measured at 15 mmHg.  Afterwards, the Veress needle was removed and a 8 mm port was placed through the Palmer's point site using Optiview technique. No injuries were noted.  Two additional incisions were made 8 cm apart along the left side of the abdominal wall from  the initial incision.  8 mm ports were then placed under direct visualization.  No injuries from trocar placements were noted. The table flexed and was placed in the Trendelenburg position with the right side elevated.  Xi robotic platform was then brought to the operative field and docked. A vessel sealer was placed through the Palmer's point port and a forced bipolar through the lower 8 mm port.   Upon entering the abdomen (organ space), I encountered a phlegmon involving the appendix. An appendix, that appeared inflamed, was identified and elevated.  Infection was present within the abdominal cavity due to appendicitis. Window created at base of appendix in the mesentery.  A white load linear cutting stapler was placed through the Palmer's point port, and then used to divide and staple the base of the appendix. The vessel sealer was used to ligate the mesoappendix. No bleeding from the staple lines noted.  The appendix was placed in an endoscopic retrieval bag and removed through the Palmer's point port.  The right lower quadrant and pelvis were irrigated with warm saline.  The appendiceal stump staple line was examined again and hemostasis noted. No other pathology was identified within pelvis. The Palmer's point trocar removed and port site closed with PMI using 0 vicryl under direct vision. Remaining trocars were removed. No bleeding was noted.  The abdomen was allowed to collapse.  The Palmer's point incision was irrigated with saline.  Marcaine was instilled at the incision sites.  All skin incisions then closed with skin staples.  Wounds then dressed with 2x2s and gauze.  The patient tolerated the procedure well, awakened from anesthesia and was taken to the postanesthesia care unit in satisfactory condition.  Sponge count and instrument count correct at the  end of the procedure.  Dorothyann Brittle, DO [facility] [address] [place], [place]  [address] [phone] (office)

## 2024-06-11 NOTE — Plan of Care (Signed)
  Problem: Education: Goal: Knowledge of General Education information will improve Description: Including pain rating scale, medication(s)/side effects and non-pharmacologic comfort measures Outcome: Progressing   Problem: Clinical Measurements: Goal: Will remain free from infection Outcome: Progressing   Problem: Pain Managment: Goal: General experience of comfort will improve and/or be controlled Outcome: Progressing

## 2024-06-11 NOTE — Discharge Instructions (Addendum)
 Food Agency Name: [facility] Address: [address] [place], [place] [address] Phone: [phone] Website: www.adtsrc.org Services Offered: Meals on [employer] and Meals with Friends.   Home care, at home assisted living, volunteer services, Center  for Active Retirement, transportation  Agency Name: [facility] Address: Sites vary. Must call first. Food Pantry location: [address] [place], [place] [address] [employer] Phone: [phone] Website: none Services Offered: Museum/gallery curator, utility assistance if funds available Careers Adviser for all of [place], [employer], [employer], Office Manager and [employer] for [place] area only) Walk-in  current Id and current address verification required.  Wed-Thurs: 9:30-12:00 Agency Name: [facility] Address: [address], [place], [place] [address]  Phone: [phone] or [phone] Website: none Services Offered: Food assistance Agency Name: [employer] Address: [address], [place], [place] [address]  Phone: [phone] or [phone]  Website: none Services Offered: Serves 1 hot meal a day at 11:00 am Monday-Sunday and 5 pm  on the second and fourth Sunday of each month Agency Name: [facility] and [facility]  Services/Social Services  Address: [phone], [place], [place] [address] Phone: [phone] Website: [email or web address]   [email or web address] Services Offered: Sales Executive, [facility] program Agency Name: [facility] Address: [address] [place], [place] [address] Phone: [phone] Website: [email or web address] Services Offered: Food pantry Tuesday, Wednesday and Thursday 9am-11:30am  (need appointment) and health clinic (9:00am-11:00am)  Agency Name: [facility] Address: [address] [place] / [address] [place] Phone: [phone] [place] / [phone] [place] Website: [email or web address] Services Offered: Civil Service Fast Streamer, food pantry, soup kitchen ([place]) emergency financial  assistance, thrift stores, showers & hygiene products [place]),  Christmas Assistance, spiritual help   Ambulatory Surgery Discharge Instructions  General Anesthesia or Sedation Do not drive or operate heavy machinery for 24 hours.  Do not consume alcohol, tranquilizers, sleeping medications, or any non-prescribed medications for 24 hours. Do not make important decisions or sign any important papers in the next 24 hours. You should have someone with you tonight at home.  Activity  You are advised to go directly home from the hospital.  Restrict your activities and rest for a day.  Resume light activity tomorrow. No heavy lifting over 10 lbs or strenuous exercise.  Fluids and Diet Regular diet- go slow  Medications  If you have not had a bowel movement in 24 hours, take 2 tablespoons over the counter Milk of mag.             You May resume your blood thinners tomorrow (Aspirin, coumadin, or other).  You are being discharged with prescriptions for Opioid/Narcotic Medications: There are some specific considerations for these medications that you should know. Opioid Meds have risks & benefits. Addiction to these meds is always a concern with prolonged use Take medication only as directed Do not drive while taking narcotic pain medication Do not crush tablets or capsules Do not use a different container than medication was dispensed in Lock the container of medication in a cool, dry place out of reach of children and pets. Opioid medication can cause addiction Do not share with anyone else (this is a felony) Do not store medications for future use. Dispose of them properly.     Disposal:  Find a [place]  household drug take back site near you.  If you can't get to a  drug take back site, use the recipe below as a last resort to dispose of expired, unused or unwanted drugs. Disposal  (Do not dispose chemotherapy drugs this way, talk to your prescribing doctor instead.) Step 1: Mix drugs (do not crush) with dirt, kitty litter, or used coffee grounds and add a small amount of water to dissolve any solid medications. Step 2: Seal drugs in plastic bag. Step 3: Place plastic bag in trash. Step 4: Take prescription container and scratch out personal information, then recycle or throw away.  Operative Site  You have skin staples in place.  These will be removed at your follow up visit.  Okay to remove dressings on postoperative day 1.  Ok to english as a second language teacher. Keep wound clean and dry. No baths or swimming. No lifting more than 10 pounds.  Contact Information: If you have questions or concerns, please call our office, [phone], Monday- Thursday 8AM-5PM and Friday 8AM-12Noon.  If it is after hours or on the weekend, please call [facility] Main Number, [phone], and ask to speak to the surgeon on call for Dr. Evonnie at [facility].   SPECIFIC COMPLICATIONS TO WATCH FOR: Inability to urinate Fever over 101? F by mouth Nausea and vomiting lasting longer than 24 hours. Pain not relieved by medication ordered Swelling around the operative site Increased redness, warmth, hardness, around operative area Numbness, tingling, or cold fingers or toes Blood -soaked dressing, (small amounts of oozing may be normal) Increasing and progressive drainage from surgical area or exam site

## 2024-06-11 NOTE — Consult Note (Signed)
 [facility] Consult  Reason for Consult: Acute appendicitis Referring Physician: Dr. Wanita  Chief Complaint   Abdominal Pain    HPI: Andrew Lynch is a 28 y.o. male who presents to the hospital with a 1 day history of abdominal pain.  The pain then localized into his right lower quadrant.  He confirms some associated nausea but denies vomiting or diarrhea.  He also denies any fevers or chills.  He denies ever having pain like this in the past.  His past medical history significant for anxiety/depression, GERD, and goiter.  His surgical history is significant for some type of small bowel surgery when he was a baby, I suspect a pyloromyotomy.  He denies use of blood thinning medications.  In the emergency department, he was noted to be hemodynamically stable.  He had a leukocytosis of 12.7.  He underwent a CT of the abdomen and pelvis which demonstrated acute appendicitis.  This morning, the patient is still having right lower quadrant abdominal pain, though it is slightly improved.  He confirms passing flatus but denies any bowel movements since admission to the hospital.  Past Medical History:  Diagnosis Date   ADHD (attention deficit hyperactivity disorder)    GERD (gastroesophageal reflux disease)    Goiter    Movement disorder    Physical growth delay    Poor appetite    Puberty delay    Stomach pain     Past Surgical History:  Procedure Laterality Date   SMALL INTESTINE SURGERY      Family History  Problem Relation Age of Onset   Bipolar disorder Mother    Depression Mother    Anxiety disorder Mother    Insomnia Mother    Thyroid  cancer Father    Hypertension Father    Diabetes Paternal Uncle    Hypertension Maternal Grandmother    Heart attack Maternal Grandmother    Diabetes Maternal Grandfather    Heart disease Maternal Grandfather    Heart attack Maternal Grandfather    Stroke Maternal Grandfather    Epilepsy Maternal Grandfather     Thyroid  disease Neg Hx     Social History   Tobacco Use   Smoking status: Former    Types: Cigarettes    Passive exposure: Past   Smokeless tobacco: Never  Vaping Use   Vaping status: Former  Substance Use Topics   Alcohol use: No   Drug use: No    Medications: I have reviewed the patient's current medications.  Allergies  Allergen Reactions   Ivp Dye [Iodinated Contrast Media] Anaphylaxis and Hives   Poison Ivy Extract Rash   Poison Oak Extract Rash   Poison Sumac Extract Rash    Swelling and rash     ROS:  Pertinent items are noted in HPI.  Blood pressure 138/86, pulse 95, temperature 97.8 F (36.6 C), temperature source Oral, resp. rate 18, height 5' 2 (1.575 m), weight 86.4 kg, SpO2 96%. Physical Exam Vitals reviewed.  Constitutional:      Appearance: He is well-developed.  HENT:     Head: Normocephalic and atraumatic.  Eyes:     Extraocular Movements: Extraocular movements intact.     Pupils: Pupils are equal, round, and reactive to light.  Cardiovascular:     Rate and Rhythm: Normal rate.  Pulmonary:     Effort: Pulmonary effort is normal.  Abdominal:     Comments: Abdomen soft, nondistended, no percussion tenderness, tenderness to palpation of the right lower quadrant; no rigidity,  guarding, rebound tenderness; positive McBurney sign  Skin:    General: Skin is warm and dry.  Neurological:     General: No focal deficit present.     Mental Status: He is alert and oriented to person, place, and time.  Psychiatric:        Mood and Affect: Mood normal.        Behavior: Behavior normal.     Results: Results for orders placed or performed during the hospital encounter of 06/11/24 (from the past 48 hours)  CBC with Differential/Platelet     Status: Abnormal   Collection Time: 06/11/24 12:58 AM  Result Value Ref Range   WBC 12.7 (H) 4.0 - 10.5 K/uL   RBC 5.28 4.22 - 5.81 MIL/uL   Hemoglobin 15.8 13.0 - 17.0 g/dL   HCT 55.5 60.9 - 47.9 %   MCV 84.1  80.0 - 100.0 fL   MCH 29.9 26.0 - 34.0 pg   MCHC 35.6 30.0 - 36.0 g/dL   RDW 87.8 88.4 - 84.4 %   Platelets 353 150 - 400 K/uL   nRBC 0.0 0.0 - 0.2 %   Neutrophils Relative % 69 %   Neutro Abs 8.7 (H) 1.7 - 7.7 K/uL   Lymphocytes Relative 18 %   Lymphs Abs 2.3 0.7 - 4.0 K/uL   Monocytes Relative 9 %   Monocytes Absolute 1.1 (H) 0.1 - 1.0 K/uL   Eosinophils Relative 3 %   Eosinophils Absolute 0.4 0.0 - 0.5 K/uL   Basophils Relative 1 %   Basophils Absolute 0.1 0.0 - 0.1 K/uL   Immature Granulocytes 0 %   Abs Immature Granulocytes 0.03 0.00 - 0.07 K/uL    Comment: Performed at [facility], [address], [place], [place] [address]  Basic metabolic panel with GFR     Status: Abnormal   Collection Time: 06/11/24 12:58 AM  Result Value Ref Range   Sodium 140 135 - 145 mmol/L   Potassium 3.3 (L) 3.5 - 5.1 mmol/L   Chloride 102 98 - 111 mmol/L   CO2 27 22 - 32 mmol/L   Glucose, Bld 124 (H) 70 - 99 mg/dL    Comment: Glucose reference range applies only to samples taken after fasting for at least 8 hours.   BUN 9 6 - 20 mg/dL   Creatinine, Ser 9.03 0.61 - 1.24 mg/dL   Calcium 9.1 8.9 - 89.6 mg/dL   GFR, Estimated >39 >39 mL/min    Comment: (NOTE) Calculated using the CKD-EPI Creatinine Equation (2021)    Anion gap 11 5 - 15    Comment: Performed at [facility], [address], [place], [place] [address]  Magnesium     Status: None   Collection Time: 06/11/24  6:56 AM  Result Value Ref Range   Magnesium 2.0 1.7 - 2.4 mg/dL    Comment: Performed at [facility], [address] [place], [place] [address]  Phosphorus     Status: None   Collection Time: 06/11/24  6:56 AM  Result Value Ref Range   Phosphorus 4.0 2.5 - 4.6 mg/dL    Comment: Performed at [facility], [address], [place], [place] [address]    CT ABDOMEN PELVIS WO CONTRAST Result Date: 06/11/2024 EXAM: CT ABDOMEN AND PELVIS WITHOUT CONTRAST 06/11/2024 01:11:12 AM TECHNIQUE: CT of the abdomen and pelvis was  performed without the administration of intravenous contrast. Multiplanar reformatted images are provided for review. Automated exposure control, iterative reconstruction, and/or weight-based adjustment of the mA/kV  was utilized to reduce the radiation dose to as low as reasonably achievable. COMPARISON: None available. CLINICAL HISTORY: RLQ abdominal pain. FINDINGS: LOWER CHEST: No acute abnormality. LIVER: The liver is unremarkable. GALLBLADDER AND BILE DUCTS: Gallbladder is unremarkable. No biliary ductal dilatation. SPLEEN: No acute abnormality. PANCREAS: No acute abnormality. ADRENAL GLANDS: No acute abnormality. KIDNEYS, URETERS AND BLADDER: No stones in the kidneys or ureters. No hydronephrosis. No perinephric or periureteral stranding. Urinary bladder is unremarkable. GI AND BOWEL: Stomach demonstrates no acute abnormality. There is no bowel obstruction. Small appendicolith noted within the mid appendix. Distal to the appendicolith, the appendix is dilated measuring 12 mm with surrounding inflammation. Findings compatible with acute appendicitis. PERITONEUM AND RETROPERITONEUM: No ascites. No free air. VASCULATURE: Aorta is normal in caliber. LYMPH NODES: No lymphadenopathy. REPRODUCTIVE ORGANS: No acute abnormality. BONES AND SOFT TISSUES: No acute osseous abnormality. No focal soft tissue abnormality. IMPRESSION: 1. Acute appendicitis with a small appendicolith in the mid appendix. 2. These results were called to Dr. Haze at the time of interpretation. Electronically signed by: Franky Crease MD 06/11/2024 01:24 AM EDT RP Workstation: [id number]     Assessment & Plan:  Saintclair Schroader is a 28 y.o. male who was admitted with acute appendicitis.  Imaging and blood work evaluated by myself.   -We discussed the pathophysiology of appendicitis, and the treatment options -Discussed the risk of laparoscopic appendectomy and the option of antibiotics alone. Discussed that in [place] and some trials in  the [place] , antibiotics are used for simple appendicitis. Discussed that research shows a 40% failure rate for antibiotics alone.  Discussed risk of surgery including but not limited to bleeding, infection, injury to other organs, normal appendix, need for open surgery, development of intra-abdominal abscess, and after this discussion the patient has decided to proceed with surgery. -Plan for robotic assisted laparoscopic appendectomy today -Continue IV Rocephin and Flagyl -NPO -IVF -PRN pain control and antiemetics -Further recommendations to follow surgery -If patient tolerates a diet without nausea and vomiting and his pain is well-controlled with oral medications, he can be discharged home later today versus tomorrow  All questions were answered to the satisfaction of the patient and family.  Note: Portions of this report may have been transcribed using voice recognition software. Every effort has been made to ensure accuracy; however, inadvertent computerized transcription errors may still be present.   -- Dorothyann Brittle, DO [facility] [address] [place] [address], [place] [address] [phone] (office)

## 2024-06-11 NOTE — TOC CM/SW Note (Signed)
 Transition of Care [facility]) - Inpatient Brief Assessment   Patient Details  Name: Andrew Lynch MRN: [id number] Date of Birth: 1996-02-15  [facility]) CM/SW Contact:    Noreen KATHEE Pinal, LCSWA Phone Number: 06/11/2024, 8:45 AM   Clinical Narrative:  Inpatient Care Management (ICM) has reviewed patient and no other ICM needs have been identified at this time. We will continue to monitor patient advancement through interdisciplinary progression rounds. If new patient transition needs arise, please place a ICM consult.  Transition of Care Asessment: Insurance and Status: Insurance coverage has been reviewed Patient has primary care physician: Yes Home environment has been reviewed: Single Family Home Prior level of function:: Independent Prior/Current Home Services: No current home services Social Drivers of Health Review: SDOH reviewed interventions complete (Food resources added to AVS) Readmission risk has been reviewed: Yes Transition of care needs: no transition of care needs at this time

## 2024-06-11 NOTE — Transfer of Care (Signed)
 Immediate Anesthesia Transfer of Care Note  Patient: Andrew Lynch  Procedure(s) Performed: APPENDECTOMY, ROBOT-ASSISTED, LAPAROSCOPIC (Abdomen)  Patient Location: PACU  Anesthesia Type:General  Level of Consciousness: awake, alert , and oriented  Airway & Oxygen Therapy: Patient Spontanous Breathing  Post-op Assessment: Report given to RN and Post -op Vital signs reviewed and stable  Post vital signs: Reviewed and stable  Last Vitals:  Vitals Value Taken Time  BP    Temp    Pulse    Resp    SpO2      Last Pain:  Vitals:   06/11/24 1026  TempSrc:   PainSc: 2       Patients Stated Pain Goal: 0 (06/11/24 0331)  Complications: No notable events documented.

## 2024-06-11 NOTE — ED Notes (Signed)
 Pt indicated that he went to stand up and his abd pain returned.  RN requested medication from provider which was ordered.  When RN returned pt reported that his pain was once again gone.  Medications returned.

## 2024-06-12 ENCOUNTER — Encounter (HOSPITAL_COMMUNITY): Payer: Self-pay | Admitting: Surgery

## 2024-06-12 LAB — COMPREHENSIVE METABOLIC PANEL WITH GFR
ALT: 31 U/L (ref 0–44)
AST: 24 U/L (ref 15–41)
Albumin: 4.1 g/dL (ref 3.5–5.0)
Alkaline Phosphatase: 75 U/L (ref 38–126)
Anion gap: 7 (ref 5–15)
BUN: 5 mg/dL — ABNORMAL LOW (ref 6–20)
CO2: 28 mmol/L (ref 22–32)
Calcium: 8.7 mg/dL — ABNORMAL LOW (ref 8.9–10.3)
Chloride: 104 mmol/L (ref 98–111)
Creatinine, Ser: 0.87 mg/dL (ref 0.61–1.24)
GFR, Estimated: >60 mL/min (ref >60)
Glucose, Bld: 103 mg/dL — ABNORMAL HIGH (ref 70–99)
Potassium: 3.4 mmol/L — ABNORMAL LOW (ref 3.5–5.1)
Sodium: 138 mmol/L (ref 135–145)
Total Bilirubin: 0.5 mg/dL (ref 0.0–1.2)
Total Protein: 6.7 g/dL (ref 6.5–8.1)

## 2024-06-12 LAB — CBC
HCT: 39.2 % (ref 39.0–52.0)
Hemoglobin: 13.6 g/dL (ref 13.0–17.0)
MCH: 29.5 pg (ref 26.0–34.0)
MCHC: 34.7 g/dL (ref 30.0–36.0)
MCV: 85 fL (ref 80.0–100.0)
Platelets: 294 K/uL (ref 150–400)
RBC: 4.61 MIL/uL (ref 4.22–5.81)
RDW: 12 % (ref 11.5–15.5)
WBC: 8 K/uL (ref 4.0–10.5)
nRBC: 0 % (ref 0.0–0.2)

## 2024-06-12 MED ORDER — POLYETHYLENE GLYCOL 3350 17 G PO PACK: 17.0000 g | PACK | Freq: Every day | ORAL | 0 refills | Status: AC

## 2024-06-12 MED ORDER — POTASSIUM CHLORIDE 20 MEQ PO PACK
40.0000 meq | PACK | Freq: Once | ORAL | Status: AC
  Administered 2024-06-12: 40 meq via ORAL
  Filled 2024-06-12: qty 2

## 2024-06-12 MED ORDER — POLYETHYLENE GLYCOL 3350 17 G PO PACK
17.0000 g | PACK | Freq: Every day | ORAL | Status: DC
  Administered 2024-06-12: 17 g via ORAL
  Filled 2024-06-12: qty 1

## 2024-06-12 NOTE — Plan of Care (Signed)

## 2024-06-13 LAB — SURGICAL PATHOLOGY

## 2024-06-15 ENCOUNTER — Ambulatory Visit: Admitting: Internal Medicine

## 2024-06-15 ENCOUNTER — Encounter: Payer: Self-pay | Admitting: Internal Medicine

## 2024-06-15 VITALS — BP 129/75 | HR 75 | Ht 62.0 in | Wt 188.0 lb

## 2024-06-15 DIAGNOSIS — Z09 Encounter for follow-up examination after completed treatment for conditions other than malignant neoplasm: Secondary | ICD-10-CM

## 2024-06-15 DIAGNOSIS — Z9049 Acquired absence of other specified parts of digestive tract: Secondary | ICD-10-CM | POA: Diagnosis not present

## 2024-06-15 DIAGNOSIS — T148XXA Other injury of unspecified body region, initial encounter: Secondary | ICD-10-CM | POA: Insufficient documentation

## 2024-06-15 DIAGNOSIS — S30821A Blister (nonthermal) of abdominal wall, initial encounter: Secondary | ICD-10-CM | POA: Diagnosis not present

## 2024-06-15 NOTE — Assessment & Plan Note (Signed)
 Hospital chart reviewed, including discharge summary Medications reconciled and reviewed with the patient in detail

## 2024-06-15 NOTE — Patient Instructions (Signed)
 Please apply Neosporin locally.  Okay to clean the incision area with warm water and soap.

## 2024-06-15 NOTE — Progress Notes (Signed)
 Established Patient Office Visit  Subjective:  Patient ID: Andrew Lynch, male    DOB: [birth year]  Age: 28 y.o. MRN: [id number]  CC:  Chief Complaint  Patient presents with   Hospitalization Follow-up    Following from hospital stay for appendix removal.    HPI Andrew Lynch is a 28 y.o. male with recent history of appendicectomy presents for f/u of recent hospitalization.  He presented to the emergency department on 06/11/24 due to right lower abdominal area pain.  CT abdomen and pelvis showed acute appendicitis with a small appendicolith in the mid appendix. He underwent a robotic assisted laparoscopic appendectomy on 11/2. Postoperatively, he was able to tolerate a regular diet without nausea and vomiting, and pain was controlled with oral medications.  He was discharged on 06/12/24.  He takes oxycodone  as needed for severe pain.  He has a follow-up with general surgeon for staple removal on 06/28/24.  He denies any fever or chills.  Has regular bowel movement now.  He has noticed mild redness and itching around the staples area.  He has 2 small blisters on the lateral side of the incision as well, that he attributes to adhesive taping.  Past Medical History:  Diagnosis Date   ADHD (attention deficit hyperactivity disorder)    GERD (gastroesophageal reflux disease)    Goiter    Movement disorder    Physical growth delay    Poor appetite    Puberty delay    Stomach pain     Past Surgical History:  Procedure Laterality Date   SMALL INTESTINE SURGERY     XI ROBOTIC LAPAROSCOPIC ASSISTED APPENDECTOMY N/A 06/11/2024   Procedure: APPENDECTOMY, ROBOT-ASSISTED, LAPAROSCOPIC;  Surgeon: Evonnie Dorothyann LABOR, DO;  Location: [facility];  Service: General;  Laterality: N/A;    Family History  Problem Relation Age of Onset   Bipolar disorder Mother    Depression Mother    Anxiety disorder Mother    Insomnia Mother    Thyroid  cancer Father    Hypertension Father     Diabetes Paternal Uncle    Hypertension Maternal Grandmother    Heart attack Maternal Grandmother    Diabetes Maternal Grandfather    Heart disease Maternal Grandfather    Heart attack Maternal Grandfather    Stroke Maternal Grandfather    Epilepsy Maternal Grandfather    Thyroid  disease Neg Hx     Social History   Socioeconomic History   Marital status: Married    Spouse name: Not on file   Number of children: Not on file   Years of education: Not on file   Highest education level: Not on file  Occupational History   Not on file  Tobacco Use   Smoking status: Former    Types: Cigarettes    Passive exposure: Past   Smokeless tobacco: Never  Vaping Use   Vaping status: Former  Substance and Sexual Activity   Alcohol use: No   Drug use: No   Sexual activity: Not on file  Other Topics Concern   Not on file  Social History Narrative   Right Handed   One Story Home   Drinks Caffeine    Social Drivers of Health   Financial Resource Strain: Not on file  Food Insecurity: Food Insecurity Present (06/11/2024)   Hunger Vital Sign    Worried About Running Out of Food in the Last Year: Often true    Ran Out of Food in the Last Year: Often true  Transportation  Needs: No Transportation Needs (06/11/2024)   PRAPARE - Administrator, Civil Service (Medical): No    Lack of Transportation (Non-Medical): No  Physical Activity: Not on file  Stress: Not on file  Social Connections: Not on file  Intimate Partner Violence: Not At Risk (06/11/2024)   Humiliation, Afraid, Rape, and Kick questionnaire    Fear of Current or Ex-Partner: No    Emotionally Abused: No    Physically Abused: No    Sexually Abused: No    Outpatient Medications Prior to Visit  Medication Sig Dispense Refill   acetaminophen  (TYLENOL ) 500 MG tablet Take 2 tablets (1,000 mg total) by mouth every 6 (six) hours for 7 days. 56 tablet 0   docusate sodium (COLACE) 100 MG capsule Take 1 capsule (100 mg  total) by mouth 2 (two) times daily. 60 capsule 2   famotidine  (PEPCID ) 20 MG tablet Take 20 mg by mouth daily.     oxyCODONE  (ROXICODONE ) 5 MG immediate release tablet Take 1 tablet (5 mg total) by mouth every 6 (six) hours as needed. 8 tablet 0   polyethylene glycol (MIRALAX / GLYCOLAX) 17 g packet Take 17 g by mouth daily. 14 each 0   No facility-administered medications prior to visit.    Allergies  Allergen Reactions   Banana Anaphylaxis and Shortness Of Breath   Ivp Dye [Iodinated Contrast Media] Anaphylaxis and Hives   Poison Ivy Extract Rash   Poison Oak Extract Rash   Poison Sumac Extract Rash    Swelling and rash    ROS Review of Systems  Constitutional:  Negative for chills and fever.  HENT:  Negative for congestion and sore throat.   Eyes:  Negative for pain and discharge.  Respiratory:  Negative for cough and shortness of breath.   Cardiovascular:  Negative for chest pain and palpitations.  Gastrointestinal:  Negative for diarrhea, nausea and vomiting.  Endocrine: Negative for polydipsia and polyuria.  Genitourinary:  Negative for dysuria and hematuria.  Musculoskeletal:  Negative for neck pain and neck stiffness.  Skin:  Negative for rash.  Neurological:  Negative for dizziness, weakness, numbness and headaches.  Psychiatric/Behavioral:  Negative for agitation and behavioral problems.       Objective:    Physical Exam Vitals reviewed.  Constitutional:      General: He is not in acute distress.    Appearance: He is not diaphoretic.  HENT:     Head: Normocephalic and atraumatic.     Nose: Nose normal.     Mouth/Throat:     Mouth: Mucous membranes are moist.  Eyes:     General: No scleral icterus.    Extraocular Movements: Extraocular movements intact.  Cardiovascular:     Rate and Rhythm: Normal rate and regular rhythm.     Heart sounds: No murmur heard. Pulmonary:     Breath sounds: Normal breath sounds. No wheezing or rales.  Abdominal:      General: Bowel sounds are normal.     Palpations: Abdomen is soft.     Tenderness: There is no abdominal tenderness.     Comments: Incision site C/D/I - staples in place  Musculoskeletal:     Cervical back: Neck supple. No tenderness.     Right lower leg: No edema.     Left lower leg: No edema.  Skin:    General: Skin is warm.     Findings: No rash.  Neurological:     General: No focal deficit present.  Mental Status: He is alert and oriented to person, place, and time.  Psychiatric:        Mood and Affect: Mood normal.        Behavior: Behavior normal.     BP 129/75   Pulse 75   Ht 5' 2 (1.575 m)   Wt 188 lb (85.3 kg)   SpO2 98%   BMI 34.39 kg/m  Wt Readings from Last 3 Encounters:  06/15/24 188 lb (85.3 kg)  06/11/24 190 lb 7.6 oz (86.4 kg)  03/18/24 180 lb 12.4 oz (82 kg)    Lab Results  Component Value Date   TSH 1.230 11/09/2022   Lab Results  Component Value Date   WBC 8.0 06/12/2024   HGB 13.6 06/12/2024   HCT 39.2 06/12/2024   MCV 85.0 06/12/2024   PLT 294 06/12/2024   Lab Results  Component Value Date   NA 138 06/12/2024   K 3.4 (L) 06/12/2024   CO2 28 06/12/2024   GLUCOSE 103 (H) 06/12/2024   BUN 5 (L) 06/12/2024   CREATININE 0.87 06/12/2024   BILITOT 0.5 06/12/2024   ALKPHOS 75 06/12/2024   AST 24 06/12/2024   ALT 31 06/12/2024   PROT 6.7 06/12/2024   ALBUMIN 4.1 06/12/2024   CALCIUM 8.7 (L) 06/12/2024   ANIONGAP 7 06/12/2024   EGFR 121 11/09/2022   Lab Results  Component Value Date   CHOL 127 11/09/2022   Lab Results  Component Value Date   HDL 33 (L) 11/09/2022   Lab Results  Component Value Date   LDLCALC 72 11/09/2022   Lab Results  Component Value Date   TRIG 119 11/09/2022   Lab Results  Component Value Date   CHOLHDL 3.8 11/09/2022   Lab Results  Component Value Date   HGBA1C 5.2 11/09/2022      Assessment & Plan:   Problem List Items Addressed This Visit       Other   S/P laparoscopic appendectomy -  Primary   Had acute appendicitis, s/p laparoscopic appendicectomy on 06/11/24 Incision site - C/D/I Follow-up with general surgeon for staple removal Advised to apply Neosporin locally for itching and local bacterial PPx      Hospital discharge follow-up   Hospital chart reviewed, including discharge summary Medications reconciled and reviewed with the patient in detail      Blister   Small blisters on abdominal wall, likely due to adhesive Advised to applied Neosporin for local irritation Advised to call if he has puslike discharge locally or other systemic symptoms such as fever or chills       No orders of the defined types were placed in this encounter.   Follow-up: Return if symptoms worsen or fail to improve.    Suzzane MARLA Blanch, MD

## 2024-06-15 NOTE — Assessment & Plan Note (Addendum)
 Small blisters on abdominal wall, likely due to adhesive Advised to applied Neosporin for local irritation Advised to call if he has puslike discharge locally or other systemic symptoms such as fever or chills

## 2024-06-15 NOTE — Assessment & Plan Note (Signed)
 Had acute appendicitis, s/p laparoscopic appendicectomy on 06/11/24 Incision site - C/D/I Follow-up with general surgeon for staple removal Advised to apply Neosporin locally for itching and local bacterial PPx

## 2024-06-28 ENCOUNTER — Ambulatory Visit (INDEPENDENT_AMBULATORY_CARE_PROVIDER_SITE_OTHER): Admitting: Surgery

## 2024-06-28 ENCOUNTER — Encounter: Payer: Self-pay | Admitting: Surgery

## 2024-06-28 VITALS — BP 125/80 | HR 61 | Temp 97.5°F | Resp 16 | Ht 62.0 in | Wt 193.0 lb

## 2024-06-28 DIAGNOSIS — Z09 Encounter for follow-up examination after completed treatment for conditions other than malignant neoplasm: Secondary | ICD-10-CM

## 2024-06-28 NOTE — Progress Notes (Signed)
 [facility] Note   HPI:  28 y.o. Male presents to clinic for post-op follow-up status post robotic assisted laparoscopic appendectomy on 11/2.  Since discharge from the hospital, the patient has been doing well.  He did have some soreness for a few days, but this has since resolved.  He does have some soreness at the staple sites, but otherwise has no pain.  He is tolerating a diet without nausea and vomiting and moving his bowels without issue.  He denies fevers and chills.  Review of Systems:  All other review of systems: otherwise negative   Vital Signs:  BP 125/80   Pulse 61   Temp (!) 97.5 F (36.4 C) (Oral)   Resp 16   Ht 5' 2 (1.575 m)   Wt 193 lb (87.5 kg)   SpO2 98%   BMI 35.30 kg/m    Physical Exam:  Physical Exam Vitals reviewed.  Constitutional:      Appearance: Normal appearance.  Abdominal:     Comments: Abdomen soft, nondistended, no percussion tenderness, nontender palpation; no rigidity, guarding, rebound tenderness; skin staples in place at laparoscopic incision sites, incisions well-healed  Neurological:     Mental Status: He is alert.     Laboratory studies: None  Imaging:  None  Pathology: A. APPENDIX, APPENDECTOMY:  Acute and subacute appendicitis with acute and chronic fibrinopurulent  serositis    Assessment:  28 y.o. yo Male who presents for follow-up status post robotic assisted laparoscopic appendectomy on 11/2.  Plan:  - Patient overall doing well since his surgery.  Tolerating a diet, moving his bowels, and pain well-controlled - Skin staples were removed without issue - Advised that he no longer has weight restrictions, but should slowly increase his activity back to his activity level prior to surgery - Follow up with me as needed  All of the above recommendations were discussed with the patient and patient's family, and all of patient's and family's questions were answered to their expressed satisfaction.  Note:  Portions of this report may have been transcribed using voice recognition software. Every effort has been made to ensure accuracy; however, inadvertent computerized transcription errors may still be present.   Dorothyann Brittle, DO [facility] [address], [place] [address] [phone] (office)
